# Patient Record
Sex: Female | Born: 1990 | Race: Black or African American | Hispanic: No | Marital: Single | Smoking: Never smoker
Health system: Southern US, Community
[De-identification: ages and names within clinical notes are randomized; demographics above are authoritative.]

---
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
## 2014-05-31 NOTE — Op Note (Signed)
PATIENT NAME:  Erika Wiley, Erika Wiley MR#:  [id number] DATE OF BIRTH:  Jun 26, 1990  DATE OF PROCEDURE:  06/16/2011  PREOPERATIVE DIAGNOSIS: Intrauterine pregnancy, 38-week gestation, fetal intolerance to labor.   POSTOPERATIVE DIAGNOSIS: Intrauterine pregnancy, 38-week gestation, fetal intolerance to labor.   PROCEDURE PERFORMED: Low transverse cesarean section.   SURGEON: Deloris Pinghilip J. Luella Cookosenow, MD  FIRST ASSISTANT: Shelby [address]     OPERATIVE FINDINGS: 232630-gram female infant delivered at 11:35:00 a.m. Apgars 9 and 9- [place]. Infant was possibly intrauterine growth retarded.   PREOP SITUATION: 24 year old gravida 1, para 0 presented to the office yesterday and demonstrated several decelerations and therefore was brought into the hospital today for induction. Once the Pitocin started, about 10 minutes later she again demonstrated a prolonged deceleration. Therefore,  mutual decision was made to proceed with cesarean section.    DESCRIPTION OF PROCEDURE: After adequate conduction anesthesia, the patient was prepped and draped in routine fashion. A skin incision in modified Pfannenstiel fashion was made and carried down the various layers and the peritoneal cavity was entered. Bladder flap was created and the bladder was pushed down. The above-described infant was delivered without difficulty. The placenta was removed manually. Cervix was "cracked". The uterus was then closed in a continuous lock suture of chromic one. All areas of surgery were inspected and found to be hemostatic. The pelvis was lavaged with copious amounts of saline. The rectus muscles were reapproximated in the midline. The fascia was reapproximated with continuous suture of Maxon. Multiple 3-0 plain sutures were placed in the subcuticular fat and the skin was closed with skin staples. Estimated blood loss 500 mL.    The patient tolerated the procedure well and left the operating room in good condition. Sponge and needle counts were said to  be correct at the end of the procedure.    ____________________________ Deloris PingPhilip J. Luella Cookosenow, MD pjr:bjt D: 06/16/2011 12:10:35 ET T: 06/16/2011 13:01:51 ET JOB#: [id number]  cc: Deloris PingPhilip J. Luella Cookosenow, MD, <Dictator> Towana BadgerPHILIP J ROSENOW MD ELECTRONICALLY SIGNED 06/18/2011 6:26

## 2014-06-16 NOTE — H&P (Signed)
L&D Evaluation:  History Expanded:   HPI 24 yo G1 at 6926 w 6 days.who presents tonight with c/o old blood from vagina, no crampingno poainno sex since sept, nothing int he vagina, pt was wiping from urinating and she saw it, came staright here. no cough colds or other problems,    Gravida 1    Term 0    PreTerm 0    Abortion 0    Living 0    Group B Strep Results (Result >5wks must be treated as unknown) unknown/result > 5 weeks ago    Maternal HIV Unknown    Maternal Syphilis Ab Unknown    Maternal Varicella Unknown    Rubella Results unknown    Maternal [facility]Dap [facility]    Presents with bloody vaginal show    Patient's Medical History No Chronic Illness    Patient's Surgical History none    Medications Pre Natal Vitamins    Allergies NKDA    Social History none    Family History Non-Contributory   ROS:   ROS All systems were reviewed.  HEENT, CNS, GI, GU, Respiratory, CV, Renal and Musculoskeletal systems were found to be normal.   Exam:   Vital Signs stable    General no apparent distress    Mental Status clear    Chest clear    Heart normal sinus rhythm    Abdomen gravid, non-tender    Mebranes Intact    Ucx regular, not felt by patient, but on monitor    Ucx Frequency 4 min    Ucx Pain Scale 0    Skin dry    Lymph no lymphadenopathy    Other baby VERY active   Impression:   Impression preterm contractions   Plan:   Plan UA, EFM/NST, monitor contractions and for cervical change    Comments start terb x 1 and ivfluid bolus,    Follow Up Appointment already scheduled. next week   Electronic Signatures: Adria DevonKlett, Neelah Mannings (MD)  (Signed 16-Feb-13 03:40)  Authored: L&D Evaluation   Last Updated: 16-Feb-13 03:40 by Adria DevonKlett, Celes Dedic (MD)

## 2014-06-16 NOTE — H&P (Signed)
L&D Evaluation:  History:   HPI 24 yo G2P0010 at 6383w3d gestational age by LMP consistent with 11 week ultrasound.  Pregnancy complicated by morbid obesity with BMI of 43 at intake, +chlamydia during pregnancy with cure verified.  She presents after being seen in clinic for routine ob appt and NST for obesity.  She had what was reported to be a variable deceleration to the 70's of short duration. She was sent to labor and delivery for prolonged monitoring.  She reports no contractions, no leakage of fluid, no vaginal bleeding, and reports normal fetal movement.    Patient's Medical History Obesity    Patient's Surgical History none    Medications Pre Natal Vitamins  Tums    Allergies NKDA    Social History none    Family History Non-Contributory   History Expanded:   Blood Type B positive    Group B Strep Results (Result >5wks must be treated as unknown) positive    Maternal Varicella Immune    Rubella Results immune    Maternal T-Dap Unknown    EDC 26-Jun-2011   ROS:   ROS All systems were reviewed.  HEENT, CNS, GI, GU, Respiratory, CV, Renal and Musculoskeletal systems were found to be normal., unless noted in HPI   Exam:   Vital Signs BP >140/90  labile BPs, mostly in normal range    Urine Protein Protein/Creatinine Ration: 142    General no apparent distress    Mental Status clear    Chest clear    Heart irregularly irregular    Abdomen gravid, non-tender    Estimated Fetal Weight Average for gestational age    Back no CVAT    Edema no edema    Pelvic 3/50/-2 per RN    Mebranes Intact    FHT Description 140/mod var/+accels/+ decel (appeared late) to 70 x 2.5 minutes with return to baseline.    Ucx irregular, infrequent    Other EKG: regular rate, irregular rhthym.  No evidence of ischemia or infarction.  EKG sent to ED for ED physician interpretation.  Read was, "no concerning findings." Ultrasound: AFI = 8cm   Impression:   Impression 1)  Obesity, 2) Non-reassuring antenatal testing, 3)  narrow complex arrhthymia   Plan:   Plan EFM/NST, antibiotics for GBBS prophylaxis    Comments - admit for Induction of labor due to non-reassuring antenatal testing in the setting of obesity with BMI 43. - Discussed implications of fetal heart rate deceleration and its possible relationship to a placenta that is not optimally functioning.  Discussed possible need for cesarean section, if fetus does not tolerate labor.  Will proceed with pitocin induction with slow titration to contraction stress test.  If fetus tolerates, will continue per protocol.  If not, will proceed to primary cesarean section. - arrhythmia: Will have anesthesia read when available. the patient is asymptomatic and there are no signs of ischmia or infarction.  Will ensure follow-up postpartum. -GBS positive, Ampicillin per protocol   Electronic Signatures: Conard NovakJackson, Adama Ferber D (MD)  (Signed 09-May-13 22:50)  Authored: L&D Evaluation   Last Updated: 09-May-13 22:50 by Conard NovakJackson, Laranda Burkemper D (MD)

## 2015-09-14 ENCOUNTER — Ambulatory Visit
Admission: EM | Admit: 2015-09-14 | Discharge: 2015-09-14 | Disposition: A | Discharge status: Home / Self Care | Payer: 59 | Attending: Family Medicine | Admitting: Family Medicine

## 2015-09-14 ENCOUNTER — Encounter: Payer: Self-pay | Admitting: *Deleted

## 2015-09-14 DIAGNOSIS — J029 Acute pharyngitis, unspecified: Secondary | ICD-10-CM | POA: Diagnosis not present

## 2015-09-14 DIAGNOSIS — J020 Streptococcal pharyngitis: Secondary | ICD-10-CM | POA: Diagnosis not present

## 2015-09-14 LAB — RAPID STREP SCREEN (MED CTR MEBANE ONLY): Streptococcus, Group A Screen (Direct): POSITIVE — AB

## 2015-09-14 MED ORDER — PENICILLIN G BENZATHINE 1200000 UNIT/2ML IM SUSP
1.2000 10*6.[IU] | Freq: Once | INTRAMUSCULAR | Status: AC
  Administered 2015-09-14: 1.2 10*6.[IU] via INTRAMUSCULAR

## 2015-09-14 NOTE — ED Triage Notes (Signed)
Patient states she has had a very sore throat for 2 days

## 2015-09-14 NOTE — ED Provider Notes (Signed)
[facility]    CSN: [id number] Arrival date & time: 09/14/15  4098  First Provider Contact:  First MD Initiated Contact with Patient 09/14/15 1926        History   Chief Complaint Chief Complaint  Patient presents with  . Sore Throat    HPI Erika Wiley is a 25 y.o. female.   Patient's here because of sore throat. She states that she's had sore throat now for 2 days she was unaware of her fever will she got here she is running relatively high fever. She reports some nasal congestion but no marked fatigue no cough or chest congestion. She does not smoke mother has hyperlipidemia and father diabetes. No previous surgeries no pertinent medical problems. No known drug allergies   The history is provided by the patient. No language interpreter was used.  Sore Throat  This is a new problem. The current episode started 2 days ago. The problem occurs constantly. The problem has been gradually worsening. Pertinent negatives include no chest pain, no abdominal pain, no headaches and no shortness of breath. The symptoms are aggravated by swallowing. Nothing relieves the symptoms. She has tried nothing for the symptoms. The treatment provided no relief.    History reviewed. No pertinent past medical history.  There are no active problems to display for this patient.   History reviewed. No pertinent surgical history.  OB History    No data available       Home Medications    Prior to Admission medications   Not on File    Family History Family History  Problem Relation Age of Onset  . Hyperlipidemia Mother   . Diabetes Father     Social History Social History  Substance Use Topics  . Smoking status: Never Smoker  . Smokeless tobacco: Never Used  . Alcohol use Yes     Allergies   Review of patient's allergies indicates no known allergies.   Review of Systems Review of Systems  HENT: Positive for congestion and sore throat. Negative for rhinorrhea  and sneezing.   Respiratory: Negative for cough and shortness of breath.   Cardiovascular: Negative for chest pain.  Gastrointestinal: Negative for abdominal pain.  Neurological: Negative for headaches.  All other systems reviewed and are negative.    Physical Exam Triage Vital Signs ED Triage Vitals  Enc Vitals Group     BP 09/14/15 1921 129/62     Pulse Rate 09/14/15 1921 100     Resp 09/14/15 1921 18     Temp 09/14/15 1921 (!) 100.8 F (38.2 C)     Temp Source 09/14/15 1921 Tympanic     SpO2 09/14/15 1921 100 %     Weight 09/14/15 1921 260 lb (117.9 kg)     Height --      Head Circumference --      Peak Flow --      Pain Score 09/14/15 1920 9     Pain Loc --      Pain Edu? --      Excl. in GC? --    No data found.   Updated Vital Signs BP 129/62 (BP Location: Left Arm)   Pulse 100   Temp (!) 100.8 F (38.2 C) (Tympanic)   Resp 18   Wt 260 lb (117.9 kg)   LMP 08/27/2015   SpO2 100%   Visual Acuity Right Eye Distance:   Left Eye Distance:   Bilateral Distance:    Right Eye Near:  Left Eye Near:    Bilateral Near:     Physical Exam  Constitutional: She is oriented to person, place, and time. She appears well-developed and well-nourished.  HENT:  Head: Normocephalic and atraumatic.  Right Ear: Hearing, tympanic membrane, external ear and ear canal normal.  Left Ear: Hearing, tympanic membrane, external ear and ear canal normal.  Nose: Mucosal edema present. No rhinorrhea. Right sinus exhibits no maxillary sinus tenderness and no frontal sinus tenderness. Left sinus exhibits no maxillary sinus tenderness and no frontal sinus tenderness.  Mouth/Throat: She does not have dentures. Normal dentition. Posterior oropharyngeal erythema present. Tonsils are 2+ on the right. Tonsils are 2+ on the left.  Eyes: Pupils are equal, round, and reactive to light.  Neck: Normal range of motion.  Pulmonary/Chest: Effort normal.  Neurological: She is alert and oriented to  person, place, and time.  Skin: Skin is warm. No rash noted.  Psychiatric: She has a normal mood and affect.  Vitals reviewed.    UC Treatments / Results  Labs (all labs ordered are listed, but only abnormal results are displayed) Labs Reviewed  RAPID STREP SCREEN ([facility]) - Abnormal; Notable for the following:       Result Value   Streptococcus, Group A Screen (Direct) POSITIVE (*)    All other components within normal limits    EKG  EKG Interpretation None       Radiology No results found.  Procedures Procedures (including critical care time)  Medications Ordered in UC Medications  penicillin g benzathine (BICILLIN LA) 1200000 UNIT/2ML injection 1.2 Million Units (not administered)     Initial Impression / Assessment and Plan / UC Course  I have reviewed the triage vital signs and the nursing notes.  Pertinent labs & imaging results that were available during my care of the patient were reviewed by me and considered in my medical decision making (see chart for detail    Results for orders placed or performed during the hospital encounter of 09/14/15  Rapid strep screen  Result Value Ref Range   Streptococcus, Group A Screen (Direct) POSITIVE (A) NEGATIVE      Results for orders placed or performed during the hospital encounter of 09/14/15  Rapid strep screen  Result Value Ref Range   Streptococcus, Group A Screen (Direct) POSITIVE (A) NEGATIVE    Clinical Course  Patient's pharynx and tonsils were red enough without exudates is very suspicious for strep. Strep test was positive discussed earlier and she was LA Bicillin injection and will minister that. Give her work note for today and tomorrow and return to work on the 10th recommend seeing PCP in 2 weeks for proof of cure  Final Clinical Impressions(s) / UC Diagnoses   Final diagnoses:  Strep pharyngitis  Sore throat    New Prescriptions New Prescriptions   No medications on file       Hassan RowanEugene Elchonon Maxson, MD 09/14/15 2024

## 2016-06-03 ENCOUNTER — Encounter: Payer: Self-pay | Admitting: Emergency Medicine

## 2016-06-03 ENCOUNTER — Ambulatory Visit
Admission: EM | Admit: 2016-06-03 | Discharge: 2016-06-03 | Disposition: A | Discharge status: Home / Self Care | Payer: 59 | Attending: Family Medicine | Admitting: Family Medicine

## 2016-06-03 DIAGNOSIS — R300 Dysuria: Secondary | ICD-10-CM

## 2016-06-03 DIAGNOSIS — N3001 Acute cystitis with hematuria: Secondary | ICD-10-CM

## 2016-06-03 LAB — URINALYSIS, COMPLETE (UACMP) WITH MICROSCOPIC
BILIRUBIN URINE: NEGATIVE
Glucose, UA: NEGATIVE mg/dL
KETONES UR: NEGATIVE mg/dL
Nitrite: NEGATIVE
Protein, ur: 100 mg/dL — AB
pH: 5.5 (ref 5.0–8.0)

## 2016-06-03 MED ORDER — NITROFURANTOIN MONOHYD MACRO 100 MG PO CAPS: 100.0000 mg | ORAL_CAPSULE | Freq: Two times a day (BID) | ORAL | 0 refills | Status: DC

## 2016-06-03 NOTE — ED Triage Notes (Signed)
Patient c/o burning when urinating and increase in urinary frequency since Wed.

## 2016-06-03 NOTE — Discharge Instructions (Signed)
Antibiotic as prescribed.  Take care  Dr. Lezette Kitts  

## 2016-06-03 NOTE — ED Provider Notes (Signed)
[facility]    CSN: [id number] Arrival date & time: 06/03/16  1007  History   Chief Complaint Chief Complaint  Patient presents with  . Dysuria   HPI  26 year old female presents with concerns for UTI.  Patient reports that she has had dysuria, frequency, and urgency since Wednesday. She denies gross hematuria. No flank pain. No abdominal pain. No known exacerbating or relieving factors. No associated fevers or chills. No other complaints or concerns at this time.  History reviewed. No pertinent past medical history.  There are no active problems to display for this patient.  History reviewed. No pertinent surgical history.  OB History    No data available     Home Medications    Prior to Admission medications   Medication Sig Start Date End Date Taking? Authorizing Provider  nitrofurantoin, macrocrystal-monohydrate, (MACROBID) 100 MG capsule Take 1 capsule (100 mg total) by mouth 2 (two) times daily. 06/03/16   Tommie Sams, DO   Family History Family History  Problem Relation Age of Onset  . Hyperlipidemia Mother   . Diabetes Father     Social History Social History  Substance Use Topics  . Smoking status: Never Smoker  . Smokeless tobacco: Never Used  . Alcohol use Yes   Allergies   Patient has no known allergies.  Review of Systems Review of Systems   Physical Exam Triage Vital Signs ED Triage Vitals  Enc Vitals Group     BP 06/03/16 1019 (!) 128/93     Pulse Rate 06/03/16 1019 80     Resp 06/03/16 1019 16     Temp 06/03/16 1019 98.5 F (36.9 C)     Temp Source 06/03/16 1019 Oral     SpO2 06/03/16 1019 100 %     Weight 06/03/16 1017 270 lb (122.5 kg)     Height 06/03/16 1017  (1.651 m)     Head Circumference --      Peak Flow --      Pain Score 06/03/16 1017 9     Pain Loc --      Pain Edu? --      Excl. in GC? --     Updated Vital Signs BP (!) 128/93 (BP Location: Left Arm)   Pulse 80   Temp 98.5 F (36.9 C) (Oral)    Resp 16   Ht  (1.651 m)   Wt 270 lb (122.5 kg)   LMP 05/23/2016 (Approximate)   SpO2 100%   BMI 44.93 kg/m   Physical Exam  Constitutional: She is oriented to person, place, and time. She appears well-developed. No distress.  Cardiovascular: Normal rate and regular rhythm.   Pulmonary/Chest: Effort normal and breath sounds normal.  Abdominal: Soft. She exhibits no distension. There is no tenderness.  Neurological: She is alert and oriented to person, place, and time.  Psychiatric: She has a normal mood and affect.  Vitals reviewed.   UC Treatments / Results  Labs (all labs ordered are listed, but only abnormal results are displayed) Labs Reviewed  URINALYSIS, COMPLETE (UACMP) WITH MICROSCOPIC - Abnormal; Notable for the following:       Result Value   APPearance CLOUDY (*)    Specific Gravity, Urine >1.030 (*)    Hgb urine dipstick LARGE (*)    Protein, ur 100 (*)    Leukocytes, UA MODERATE (*)    Squamous Epithelial / LPF 0-5 (*)    Bacteria, UA FEW (*)    All  other components within normal limits  URINE CULTURE    EKG  EKG Interpretation None      Radiology No results found.  Procedures Procedures (including critical care time)  Medications Ordered in UC Medications - No data to display   Initial Impression / Assessment and Plan / UC Course  I have reviewed the triage vital signs and the nursing notes.  Pertinent labs & imaging results that were available during my care of the patient were reviewed by me and considered in my medical decision making (see chart for details).    26 year old female presents with concerns for UTI. History and urinalysis consistent with UTI. Treating with Macrobid.  Final Clinical Impressions(s) / UC Diagnoses   Final diagnoses:  Acute cystitis with hematuria    New Prescriptions New Prescriptions   NITROFURANTOIN, MACROCRYSTAL-MONOHYDRATE, (MACROBID) 100 MG CAPSULE    Take 1 capsule (100 mg total) by mouth 2  (two) times daily.     Tommie Sams, DO 06/03/16 1101

## 2016-06-04 LAB — URINE CULTURE

## 2016-06-07 ENCOUNTER — Ambulatory Visit (INDEPENDENT_AMBULATORY_CARE_PROVIDER_SITE_OTHER): Payer: BLUE CROSS/BLUE SHIELD | Admitting: Advanced Practice Midwife

## 2016-06-07 ENCOUNTER — Encounter: Payer: Self-pay | Admitting: Advanced Practice Midwife

## 2016-06-07 VITALS — BP 120/70 | HR 65 | Ht 65.0 in | Wt 262.0 lb

## 2016-06-07 DIAGNOSIS — Z113 Encounter for screening for infections with a predominantly sexual mode of transmission: Secondary | ICD-10-CM | POA: Diagnosis not present

## 2016-06-07 DIAGNOSIS — Z30011 Encounter for initial prescription of contraceptive pills: Secondary | ICD-10-CM

## 2016-06-07 DIAGNOSIS — Z124 Encounter for screening for malignant neoplasm of cervix: Secondary | ICD-10-CM

## 2016-06-07 DIAGNOSIS — Z01419 Encounter for gynecological examination (general) (routine) without abnormal findings: Secondary | ICD-10-CM

## 2016-06-07 MED ORDER — NORGESTIMATE-ETH ESTRADIOL 0.25-35 MG-MCG PO TABS: 1.0000 | ORAL_TABLET | Freq: Every day | ORAL | 11 refills | Status: DC

## 2016-06-07 NOTE — Progress Notes (Signed)
Patient ID: Erika Wiley, female   DOB: September 30, 1990, 26 y.o.   MRN: [id number]     Gynecology Annual Exam  PCP: Pcp Not In System  Chief Complaint:  Chief Complaint  Patient presents with  . Gynecologic Exam    History of Present Illness: Patient is a 26 y.o. G2P1011 presents for annual exam. The patient has no complaints today. She is requesting birth control pills today.  LMP: Patient's last menstrual period was 05/23/2016 (approximate). Average Interval: regular, 28 days Duration of flow: 4 days Heavy Menses: no Clots: no Intermenstrual Bleeding: no Postcoital Bleeding: no Dysmenorrhea: no  The patient is sexually active. She currently uses Condoms for contraception. She denies dyspareunia.  The patient does not perform self breast exams.  There is no notable family history of breast or ovarian cancer in her family.  The patient wears seatbelts: yes.  The patient has regular exercise: yes.  She admits to improving her diet to decrease fried foods and increase vegetables.  The patient denies current symptoms of depression.    Review of Systems: Review of Systems  Constitutional: Negative.   HENT: Negative.   Eyes: Negative.   Respiratory: Negative.   Cardiovascular: Negative.   Gastrointestinal: Negative.   Genitourinary: Negative.   Musculoskeletal: Negative.   Skin: Negative.   Neurological: Negative.   Endo/Heme/Allergies: Negative.   Psychiatric/Behavioral: Negative.     Past Medical History:  No past medical history on file.  Past Surgical History:  Past Surgical History:  Procedure Laterality Date  . CESAREAN SECTION      Gynecologic History:  Patient's last menstrual period was 05/23/2016 (approximate). Contraception: condoms Last Pap: Results were: no abnormalities   Obstetric History: G2P1011  Family History:  Family History  Problem Relation Age of Onset  . Hyperlipidemia Mother   . Diabetes Father     Social History:  Social  History   Social History  . Marital status: Single    Spouse name: N/A  . Number of children: N/A  . Years of education: N/A   Occupational History  . Not on file.   Social History Main Topics  . Smoking status: Never Smoker  . Smokeless tobacco: Never Used  . Alcohol use Yes  . Drug use: Unknown  . Sexual activity: Yes    Birth control/ protection: None   Other Topics Concern  . Not on file   Social History Narrative  . No narrative on file    Allergies:  No Known Allergies  Medications: Prior to Admission medications   Medication Sig Start Date End Date Taking? Authorizing Provider  nitrofurantoin, macrocrystal-monohydrate, (MACROBID) 100 MG capsule Take 1 capsule (100 mg total) by mouth 2 (two) times daily. 06/03/16   Tommie Sams, DO    Physical Exam Vitals: Blood pressure 120/70, pulse 65, height  (1.651 m), weight 262 lb (118.8 kg), last menstrual period 05/23/2016.  General: NAD HEENT: normocephalic, anicteric Thyroid: no enlargement, no palpable nodules Pulmonary: No increased work of breathing, CTAB Cardiovascular: RRR, distal pulses 2+ Breast: Breast symmetrical, no tenderness, no palpable nodules or masses, no skin or nipple retraction present, no nipple discharge.  No axillary or supraclavicular lymphadenopathy. Abdomen: NABS, soft, non-tender, non-distended.  Umbilicus without lesions.  No hepatomegaly, splenomegaly or masses palpable. No evidence of hernia  Genitourinary:  External: Normal external female genitalia.  Normal urethral meatus, normal  Bartholin's and Skene's glands.    Vagina: Normal vaginal mucosa, no evidence of prolapse.    Cervix:  Grossly normal in appearance, no bleeding, no CMT  Uterus: Non-enlarged, mobile, normal contour.    Adnexa: ovaries non-enlarged, no adnexal masses  Rectal: deferred  Lymphatic: no evidence of inguinal lymphadenopathy Extremities: no edema, erythema, or tenderness Neurologic: Grossly  intact Psychiatric: mood appropriate, affect full     Assessment: 26 y.o. G2P1011 Well woman exam with PAPtima Plan: Problem List Items Addressed This Visit    None    Visit Diagnoses    Cervical cancer screening    -  Primary   Relevant Orders   IGP,CtNgTv,rfx Aptima HPV ASCU   Screen for sexually transmitted diseases       Relevant Orders   IGP,CtNgTv,rfx Aptima HPV ASCU   Well woman exam with routine gynecological exam       Relevant Orders   IGP,CtNgTv,rfx Aptima HPV ASCU      1) 4) Gardasil Series discussed and if applicable offered to patient - Patient has not previously completed 3 shot series   2) STI screening was offered and accepted   3) ASCCP guidelines and rational discussed.  Patient opts for yearly screening interval  4) Contraception - Education given regarding options for contraception, including pills, Nexplanon, IUD. Pt chooses pills today.   5) Follow up 1 year for routine annual exam   Tresea Mall, CNM

## 2016-06-09 LAB — IGP,CTNGTV,RFX APTIMA HPV ASCU
CHLAMYDIA, NUC. ACID AMP: NEGATIVE
GONOCOCCUS, NUC. ACID AMP: NEGATIVE
PAP Smear Comment: 0
TRICH VAG BY NAA: NEGATIVE

## 2016-12-04 ENCOUNTER — Ambulatory Visit
Admission: EM | Admit: 2016-12-04 | Discharge: 2016-12-04 | Disposition: A | Discharge status: Home / Self Care | Payer: BLUE CROSS/BLUE SHIELD | Attending: Family Medicine | Admitting: Family Medicine

## 2016-12-04 ENCOUNTER — Encounter: Payer: Self-pay | Admitting: Emergency Medicine

## 2016-12-04 DIAGNOSIS — J020 Streptococcal pharyngitis: Secondary | ICD-10-CM | POA: Diagnosis not present

## 2016-12-04 LAB — RAPID STREP SCREEN (MED CTR MEBANE ONLY): Streptococcus, Group A Screen (Direct): POSITIVE — AB

## 2016-12-04 MED ORDER — AMOXICILLIN 500 MG PO TABS: 500.0000 mg | ORAL_TABLET | Freq: Two times a day (BID) | ORAL | 0 refills | Status: DC

## 2016-12-04 NOTE — ED Provider Notes (Signed)
[facility]    CSN: [id number] Arrival date & time: 12/04/16  1124  History   Chief Complaint Chief Complaint  Patient presents with  . Sore Throat   HPI  26 year old female presents with sore throat.  26 year old female presents with sore throat.  Started 2 days ago.  Severe.  She reports recent sick contact in her mother.  Mother has had strep pharyngitis.  No fever at home but elevated temperature here.  Difficulty swallowing.  No known exacerbating relieving factors.  No other associated symptoms.  No other complaints at this time.  History reviewed. No pertinent past medical history.  Past Surgical History:  Procedure Laterality Date  . CESAREAN SECTION      OB History    Gravida Para Term Preterm AB Living   2 1 1   1 1    SAB TAB Ectopic Multiple Live Births     1           Home Medications    Prior to Admission medications   Medication Sig Start Date End Date Taking? Authorizing Provider  amoxicillin (AMOXIL) 500 MG tablet Take 1 tablet (500 mg total) by mouth 2 (two) times daily. 12/04/16   Tommie Sams, DO   Family History Family History  Problem Relation Age of Onset  . Hyperlipidemia Mother   . Diabetes Father    Social History Social History  Substance Use Topics  . Smoking status: Never Smoker  . Smokeless tobacco: Never Used  . Alcohol use Yes   Allergies   Patient has no known allergies.   Review of Systems Review of Systems  Constitutional: Negative.   HENT: Positive for sore throat and trouble swallowing.    Physical Exam Triage Vital Signs ED Triage Vitals  Enc Vitals Group     BP 12/04/16 1134 135/73     Pulse Rate 12/04/16 1134 (!) 110     Resp 12/04/16 1134 16     Temp 12/04/16 1134 100 F (37.8 C)     Temp Source 12/04/16 1134 Oral     SpO2 12/04/16 1134 99 %     Weight 12/04/16 1133 250 lb (113.4 kg)     Height 12/04/16 1133 5\' 5"  (1.651 m)     Head Circumference --      Peak Flow --      Pain Score  12/04/16 1133 8     Pain Loc --      Pain Edu? --      Excl. in GC? --    Updated Vital Signs BP 135/73 (BP Location: Left Arm)   Pulse (!) 110   Temp 100 F (37.8 C) (Oral)   Resp 16   Ht 5\' 5"  (1.651 m)   Wt 250 lb (113.4 kg)   LMP 11/13/2016 (Approximate)   SpO2 99%   BMI 41.60 kg/m   Physical Exam  Constitutional: She is oriented to person, place, and time. She appears well-developed. No distress.  HENT:  Head: Normocephalic and atraumatic.  Nose: Nose normal.  Oropharynx with tonsillar edema and erythema and white exudate.  Neck: Neck supple.  Cardiovascular:  Tachycardic.  Regular rhythm.  2/6 murmur.  Pulmonary/Chest: Effort normal and breath sounds normal. She has no wheezes. She has no rales.  Lymphadenopathy:    She has no cervical adenopathy.  Neurological: She is alert and oriented to person, place, and time.  Psychiatric: She has a normal mood and affect.  Vitals reviewed.  UC  Treatments / Results  Labs (all labs ordered are listed, but only abnormal results are displayed) Labs Reviewed  RAPID STREP SCREEN (NOT AT [facility]) - Abnormal; Notable for the following:       Result Value   Streptococcus, Group A Screen (Direct) POSITIVE (*)    All other components within normal limits    EKG  EKG Interpretation None       Radiology No results found.  Procedures Procedures (including critical care time)  Medications Ordered in UC Medications - No data to display   Initial Impression / Assessment and Plan / UC Course  I have reviewed the triage vital signs and the nursing notes.  Pertinent labs & imaging results that were available during my care of the patient were reviewed by me and considered in my medical decision making (see chart for details).     26 year old female presents with strep pharyngitis.  Treating with Amoxicillin.  Final Clinical Impressions(s) / UC Diagnoses   Final diagnoses:  Strep pharyngitis    New  Prescriptions Discharge Medication List as of 12/04/2016 11:58 AM    START taking these medications   Details  amoxicillin (AMOXIL) 500 MG tablet Take 1 tablet (500 mg total) by mouth 2 (two) times daily., Starting Mon 12/04/2016, Normal       Controlled Substance Prescriptions [place] Controlled Substance Registry consulted? Not Applicable   Tommie SamsCook, Elenore Wanninger G, DO 12/04/16 1249

## 2016-12-04 NOTE — ED Triage Notes (Signed)
Patient c/o sore throat for 2 days.  Patient denies fevers.

## 2016-12-04 NOTE — Discharge Instructions (Signed)
Antibiotic as prescribed.  Take care  Dr. Danaria Larsen  

## 2016-12-21 ENCOUNTER — Ambulatory Visit: Payer: BLUE CROSS/BLUE SHIELD | Admitting: Advanced Practice Midwife

## 2017-03-01 ENCOUNTER — Ambulatory Visit: Payer: BLUE CROSS/BLUE SHIELD | Admitting: Advanced Practice Midwife

## 2017-05-04 ENCOUNTER — Encounter: Payer: Self-pay | Admitting: Obstetrics and Gynecology

## 2017-05-04 ENCOUNTER — Ambulatory Visit: Payer: BLUE CROSS/BLUE SHIELD | Admitting: Obstetrics and Gynecology

## 2017-05-04 ENCOUNTER — Ambulatory Visit: Payer: BLUE CROSS/BLUE SHIELD | Admitting: Advanced Practice Midwife

## 2017-05-04 VITALS — BP 118/78 | Ht 65.0 in | Wt 245.0 lb

## 2017-05-04 DIAGNOSIS — Z113 Encounter for screening for infections with a predominantly sexual mode of transmission: Secondary | ICD-10-CM

## 2017-05-04 NOTE — Progress Notes (Signed)
Obstetrics & Gynecology Office Visit   Chief Complaint  Patient presents with  . Exposure to STD    History of Present Illness: Sexually Transmitted Disease Check: Patient presents for sexually transmitted disease check. Sexual history reviewed with the patient. STD exposure: patient concerned partner is having an affair and would like to be checked.  Previous history of STD:  Chlamydia during pregnancy in 2012, treated. Current symptoms include none.  Contraception: none  Past Medical History: denies  Past Surgical History:  Procedure Laterality Date  . CESAREAN SECTION     Gynecologic History: Patient's last menstrual period was 04/25/2017.  Obstetric History: G2P1011  Family History  Problem Relation Age of Onset  . Hyperlipidemia Mother   . Diabetes Father     Social History   Socioeconomic History  . Marital status: Single    Spouse name: Not on file  . Number of children: Not on file  . Years of education: Not on file  . Highest education level: Not on file  Occupational History  . Not on file  Social Needs  . Financial resource strain: Not on file  . Food insecurity:    Worry: Not on file    Inability: Not on file  . Transportation needs:    Medical: Not on file    Non-medical: Not on file  Tobacco Use  . Smoking status: Never Smoker  . Smokeless tobacco: Never Used  Substance and Sexual Activity  . Alcohol use: Yes  . Drug use: Not on file  . Sexual activity: Yes    Birth control/protection: None  Lifestyle  . Physical activity:    Days per week: Not on file    Minutes per session: Not on file  . Stress: Not on file  Relationships  . Social connections:    Talks on phone: Not on file    Gets together: Not on file    Attends religious service: Not on file    Active member of club or organization: Not on file    Attends meetings of clubs or organizations: Not on file    Relationship status: Not on file  . Intimate partner violence:    Fear of  current or ex partner: Not on file    Emotionally abused: Not on file    Physically abused: Not on file    Forced sexual activity: Not on file  Other Topics Concern  . Not on file  Social History Narrative  . Not on file   Allergies:  No Known Allergies  Prior to Admission medications : denies    Review of Systems  Constitutional: Negative.   HENT: Negative.   Eyes: Negative.   Respiratory: Negative.   Cardiovascular: Negative.   Gastrointestinal: Negative.   Genitourinary: Negative.   Musculoskeletal: Negative.   Skin: Negative.   Neurological: Negative.   Psychiatric/Behavioral: Negative.      Physical Exam BP 118/78   Ht 5\' 5"  (1.651 m)   Wt 245 lb (111.1 kg)   LMP 04/25/2017   BMI 40.77 kg/m  Patient's last menstrual period was 04/25/2017. Physical Exam  Constitutional: She is oriented to person, place, and time. She appears well-developed and well-nourished. No distress.  Genitourinary: Vagina normal and uterus normal. Pelvic exam was performed with patient supine. There is no rash, tenderness or lesion on the right labia. There is no rash, tenderness or lesion on the left labia. Vagina exhibits no lesion. No erythema, tenderness or bleeding in the vagina. No signs of injury  around the vagina. Right adnexum does not display mass, does not display tenderness and does not display fullness. Left adnexum does not display mass, does not display tenderness and does not display fullness. Cervix does not exhibit motion tenderness, lesion or polyp.   Uterus is mobile. Uterus is not enlarged, tender, exhibiting a mass or irregular (is regular).  HENT:  Head: Normocephalic and atraumatic.  Eyes: Conjunctivae are normal. No scleral icterus.  Pulmonary/Chest: Effort normal and breath sounds normal. No respiratory distress.  Abdominal: Soft. She exhibits no distension and no mass. There is no tenderness. There is no rebound and no guarding.  Musculoskeletal: Normal range of  motion. She exhibits no edema.  Neurological: She is alert and oriented to person, place, and time. No cranial nerve deficit.  Skin: Skin is warm and dry. No erythema.  Psychiatric: She has a normal mood and affect. Her behavior is normal. Judgment normal.   Female chaperone present for pelvic and breast  portions of the physical exam  Assessment: 27 y.o. 372P1011 female here for  1. Screen for STD (sexually transmitted disease)      Plan: Problem List Items Addressed This Visit    None    Visit Diagnoses    Screen for STD (sexually transmitted disease)    -  Primary   Relevant Orders   Chlamydia/Gonococcus/Trichomonas, NAA     15 minutes spent in face to face discussion with > 50% spent in counseling,management, and coordination of care of her std screen.  Thomasene MohairStephen Jackson, MD 05/04/2017 9:58 AM

## 2017-05-08 LAB — CHLAMYDIA/GONOCOCCUS/TRICHOMONAS, NAA
CHLAMYDIA BY NAA: NEGATIVE
Gonococcus by NAA: NEGATIVE
TRICH VAG BY NAA: NEGATIVE

## 2017-10-21 ENCOUNTER — Other Ambulatory Visit: Payer: Self-pay

## 2017-10-21 ENCOUNTER — Ambulatory Visit
Admission: EM | Admit: 2017-10-21 | Discharge: 2017-10-21 | Disposition: A | Discharge status: Home / Self Care | Payer: BLUE CROSS/BLUE SHIELD | Attending: Family Medicine | Admitting: Family Medicine

## 2017-10-21 DIAGNOSIS — J039 Acute tonsillitis, unspecified: Secondary | ICD-10-CM

## 2017-10-21 DIAGNOSIS — R1310 Dysphagia, unspecified: Secondary | ICD-10-CM

## 2017-10-21 DIAGNOSIS — R070 Pain in throat: Secondary | ICD-10-CM

## 2017-10-21 DIAGNOSIS — J029 Acute pharyngitis, unspecified: Secondary | ICD-10-CM

## 2017-10-21 LAB — RAPID STREP SCREEN (MED CTR MEBANE ONLY): Streptococcus, Group A Screen (Direct): NEGATIVE

## 2017-10-21 MED ORDER — AMOXICILLIN-POT CLAVULANATE 875-125 MG PO TABS
1.0000 | ORAL_TABLET | Freq: Two times a day (BID) | ORAL | 0 refills | Status: AC
Start: 2017-10-21 — End: 2017-10-31

## 2017-10-21 MED ORDER — PREDNISONE 20 MG PO TABS: 20.0000 mg | ORAL_TABLET | Freq: Two times a day (BID) | ORAL | 0 refills | Status: AC

## 2017-10-21 MED ORDER — LIDOCAINE VISCOUS HCL 2 % MT SOLN: 15.0000 mL | OROMUCOSAL | 0 refills | Status: AC | PRN

## 2017-10-21 NOTE — ED Provider Notes (Signed)
[facility]    CSN: [id number] Arrival date & time: 10/21/17  1135     History   Chief Complaint Chief Complaint  Patient presents with  . Sore Throat    HPI Erika Wiley is a 27 y.o. female. Patient presents today with sudden onset of severe sore throat yesterday. Admits to swollen tonsils with white patches, swollen and tender lymph nodes of the neck, fatigue, and body aches. She denies known fever. Denies cough, congestion, chest tightness and breathing difficulty. Patient has not taken any OTC medications. She states that she gets strep throat almost yearly. Patient has no other complaints or concerns today.  HPI  History reviewed. No pertinent past medical history.  There are no active problems to display for this patient.   Past Surgical History:  Procedure Laterality Date  . CESAREAN SECTION      OB History    Gravida  2   Para  1   Term  1   Preterm      AB  1   Living  1     SAB      TAB  1   Ectopic      Multiple      Live Births               Home Medications    Prior to Admission medications   Medication Sig Start Date End Date Taking? Authorizing Provider  amoxicillin-clavulanate (AUGMENTIN) 875-125 MG tablet Take 1 tablet by mouth 2 (two) times daily for 10 days. 10/21/17 10/31/17  Eusebio Friendly B, PA-C  lidocaine (XYLOCAINE) 2 % solution Use as directed 15 mLs in the mouth or throat every 4 (four) hours as needed for up to 2 days for mouth pain. 10/21/17 10/23/17  Shirlee Latch, PA-C  predniSONE (DELTASONE) 20 MG tablet Take 1 tablet (20 mg total) by mouth 2 (two) times daily for 3 days. 10/21/17 10/24/17  Shirlee Latch, PA-C    Family History Family History  Problem Relation Age of Onset  . Hyperlipidemia Mother   . Diabetes Father     Social History Social History   Tobacco Use  . Smoking status: Never Smoker  . Smokeless tobacco: Never Used  Substance Use Topics  . Alcohol use: Yes    Comment: social    . Drug use: Never     Allergies   Patient has no known allergies.   Review of Systems Review of Systems  Constitutional: Positive for appetite change, chills and fatigue. Negative for fever.  HENT: Positive for ear pain (bilateral), sore throat and trouble swallowing. Negative for congestion, postnasal drip, rhinorrhea, sinus pressure and sinus pain.   Eyes: Negative for discharge and redness.  Respiratory: Negative for cough, chest tightness, shortness of breath and wheezing.   Cardiovascular: Negative for chest pain.  Gastrointestinal: Negative for abdominal pain, nausea and vomiting.  Genitourinary: Negative for difficulty urinating and dysuria.  Musculoskeletal: Positive for myalgias.  Skin: Negative for rash.  Neurological: Negative for dizziness, weakness and headaches.  Hematological: Positive for adenopathy.     Physical Exam Triage Vital Signs ED Triage Vitals [10/21/17 1145]  Enc Vitals Group     BP (!) 148/76     Pulse Rate 95     Resp 20     Temp 98.8 F (37.1 C)     Temp Source Oral     SpO2 99 %     Weight 237 lb (107.5 kg)  Height 5\' 5"  (1.651 m)     Head Circumference      Peak Flow      Pain Score 9     Pain Loc      Pain Edu?      Excl. in GC?    No data found.  Updated Vital Signs BP (!) 148/76 (BP Location: Left Arm)   Pulse 95   Temp 98.8 F (37.1 C) (Oral)   Resp 20   Ht 5\' 5"  (1.651 m)   Wt 237 lb (107.5 kg)   LMP 10/07/2017   SpO2 99%   BMI 39.44 kg/m       Physical Exam  Constitutional: She is oriented to person, place, and time. She appears well-developed and well-nourished. No distress.  HENT:  Head: Normocephalic and atraumatic.  Right Ear: Tympanic membrane and ear canal normal.  Left Ear: Tympanic membrane and ear canal normal.  Mouth/Throat: Posterior oropharyngeal edema and posterior oropharyngeal erythema present. No tonsillar abscesses. Tonsils are 3+ on the right. Tonsils are 3+ on the left. Tonsillar exudate  (bilateral whitish ).  Eyes: Pupils are equal, round, and reactive to light. EOM are normal.  Neck: Normal range of motion. Neck supple.  Cardiovascular: Normal rate, regular rhythm and normal heart sounds.  No murmur heard. Pulmonary/Chest: Effort normal and breath sounds normal. She has no wheezes. She has no rhonchi.  Abdominal: Soft. Bowel sounds are normal.  Lymphadenopathy:    She has cervical adenopathy (bilateral tender enlarged lymph nodes).  Neurological: She is alert and oriented to person, place, and time.  Skin: Skin is warm and dry. No rash noted.  Psychiatric: She has a normal mood and affect. Her behavior is normal.  Nursing note and vitals reviewed.    UC Treatments / Results  Labs (all labs ordered are listed, but only abnormal results are displayed) Labs Reviewed  RAPID STREP SCREEN ([facility] ONLY)  CULTURE, GROUP A STREP [facility]    EKG None  Radiology No results found.  Procedures Procedures (including critical care time)  Medications Ordered in UC Medications - No data to display  Initial Impression / Assessment and Plan / UC Course  I have reviewed the triage vital signs and the nursing notes.  Pertinent labs & imaging results that were available during my care of the patient were reviewed by me and considered in my medical decision making (see chart for details).   RST negative but considering exam and patient history, will treat for strep throat. Culture sent. She has no severe fatigue, abdominal pain, or posterior cervical lymph nodes so mono infection is not suspected. Advised to begin Augmentin. Also explained that prednisone will help decrease tonsil size and improve painful swallowing. F/u with our clinic if condition is not improving over the next 3 days or if it worsens at any point.   Final Clinical Impressions(s) / UC Diagnoses   Final diagnoses:  Tonsillitis  Acute pharyngitis, unspecified etiology  Dysphagia, unspecified type      Discharge Instructions     SORE THROAT/TONSILLITIS: The treatment of sore throat depends upon the cause; strep throat is treated with an antibiotic, while viral pharyngitis is treated with rest, pain relievers. Your rapid strep test was negative but based on your presentation and history this is likely a false negative and we will treat you with antibiotics. If prescribed, finish the entire course of antibiotics .Increase rest, fluids, and OTC meds as needed . May use viscous lidocaine to help throat  pain. A short course of prednisone will reduce tonsil size and improve pain. If you have any questions or concerns, please call us or stop back at any time and we will be happy to help you. If your symptoms worsen, f/u with our office or go to the ER     ED Prescriptions    Medication Sig Dispense Auth. Provider   amoxicillin-clavulanate (AUGMENTIN) 875-125 MG tablet Take 1 tablet by mouth 2 (two) times daily for 10 days. 20 tablet Eusebio Friendly B, PA-C   lidocaine (XYLOCAINE) 2 % solution Use as directed 15 mLs in the mouth or throat every 4 (four) hours as needed for up to 2 days for mouth pain. 150 mL Eusebio Friendly B, PA-C   predniSONE (DELTASONE) 20 MG tablet Take 1 tablet (20 mg total) by mouth 2 (two) times daily for 3 days. 6 tablet Shirlee Latch, PA-C     Controlled Substance Prescriptions [place] Controlled Substance Registry consulted? Not Applicable   Gareth Morgan 10/21/17 1319

## 2017-10-21 NOTE — Discharge Instructions (Addendum)
SORE THROAT/TONSILLITIS: The treatment of sore throat depends upon the cause; strep throat is treated with an antibiotic, while viral pharyngitis is treated with rest, pain relievers. Your rapid strep test was negative but based on your presentation and history this is likely a false negative and we will treat you with antibiotics. If prescribed, finish the entire course of antibiotics .Increase rest, fluids, and OTC meds as needed . May use viscous lidocaine to help throat pain. A short course of prednisone will reduce tonsil size and improve pain. If you have any questions or concerns, please call us or stop back at any time and we will be happy to help you. If your symptoms worsen, f/u with our office or go to the ER

## 2017-10-21 NOTE — ED Triage Notes (Signed)
Pt with sore throat, enlarged tonsils, and white patches starting yesterday. Pain 9/10

## 2017-10-23 LAB — CULTURE, GROUP A STREP (THRC)

## 2018-03-19 ENCOUNTER — Ambulatory Visit
Admission: EM | Admit: 2018-03-19 | Discharge: 2018-03-19 | Disposition: A | Discharge status: Home / Self Care | Payer: Self-pay | Attending: Family Medicine | Admitting: Family Medicine

## 2018-03-19 ENCOUNTER — Other Ambulatory Visit: Payer: Self-pay

## 2018-03-19 DIAGNOSIS — Z3201 Encounter for pregnancy test, result positive: Secondary | ICD-10-CM

## 2018-03-19 DIAGNOSIS — Z3490 Encounter for supervision of normal pregnancy, unspecified, unspecified trimester: Secondary | ICD-10-CM

## 2018-03-19 LAB — URINALYSIS, COMPLETE (UACMP) WITH MICROSCOPIC
Bacteria, UA: NONE SEEN
Bilirubin Urine: NEGATIVE
GLUCOSE, UA: NEGATIVE mg/dL
Hgb urine dipstick: NEGATIVE
Ketones, ur: NEGATIVE mg/dL
LEUKOCYTE UA: NEGATIVE
NITRITE: NEGATIVE
PH: 7 (ref 5.0–8.0)
Protein, ur: NEGATIVE mg/dL
RBC / HPF: NONE SEEN RBC/hpf (ref 0–5)
SPECIFIC GRAVITY, URINE: 1.025 (ref 1.005–1.030)

## 2018-03-19 LAB — PREGNANCY, URINE: Preg Test, Ur: POSITIVE — AB

## 2018-03-19 NOTE — Discharge Instructions (Signed)
Follow up with OB/GYN Vitamin B6, ginger  Prenatal vitamins

## 2018-03-19 NOTE — ED Triage Notes (Signed)
Pt with abdominal cramping x one week. No emesis or diarrhea, some nausea. Pain 8/10 and pain comes and goes. Pain is generalized.

## 2018-03-19 NOTE — ED Provider Notes (Signed)
[facility]    CSN: [id number] Arrival date & time: 03/19/18  1156     History   Chief Complaint Chief Complaint  Patient presents with  . Abdominal Pain    HPI Erika Wiley is a 28 y.o. female.   The history is provided by the patient.  Abdominal Pain  Pain location:  Generalized Pain quality: aching   Pain radiates to:  Does not radiate Pain severity:  Mild Onset quality:  Sudden Duration:  7 days Timing:  Intermittent Progression:  Unchanged Chronicity:  New Context: not alcohol use, not awakening from sleep, not diet changes, not eating, not laxative use, not medication withdrawal, not previous surgeries, not recent illness, not recent sexual activity, not recent travel, not retching, not sick contacts, not suspicious food intake and not trauma   Relieved by:  None tried Ineffective treatments:  None tried Associated symptoms: nausea   Associated symptoms: no anorexia, no belching, no chest pain, no chills, no constipation, no cough, no diarrhea, no dysuria, no fatigue, no fever, no flatus, no hematemesis, no hematochezia, no hematuria, no melena, no shortness of breath, no sore throat, no vaginal bleeding, no vaginal discharge and no vomiting   Risk factors: obesity     History reviewed. No pertinent past medical history.  There are no active problems to display for this patient.   Past Surgical History:  Procedure Laterality Date  . CESAREAN SECTION      OB History    Gravida  2   Para  1   Term  1   Preterm      AB  1   Living  1     SAB      TAB  1   Ectopic      Multiple      Live Births               Home Medications    Prior to Admission medications   Medication Sig Start Date End Date Taking? Authorizing Provider  medroxyPROGESTERone (DEPO-PROVERA) 400 MG/ML SUSP injection Inject into the muscle once.   Yes [provider]    Family History Family History  Problem Relation Age of Onset  .  Hyperlipidemia Mother   . Diabetes Father     Social History Social History   Tobacco Use  . Smoking status: Never Smoker  . Smokeless tobacco: Never Used  Substance Use Topics  . Alcohol use: Yes    Comment: social  . Drug use: Never     Allergies   Patient has no known allergies.   Review of Systems Review of Systems  Constitutional: Negative for chills, fatigue and fever.  HENT: Negative for sore throat.   Respiratory: Negative for cough and shortness of breath.   Cardiovascular: Negative for chest pain.  Gastrointestinal: Positive for abdominal pain and nausea. Negative for anorexia, constipation, diarrhea, flatus, hematemesis, hematochezia, melena and vomiting.  Genitourinary: Negative for dysuria, hematuria, vaginal bleeding and vaginal discharge.     Physical Exam Triage Vital Signs ED Triage Vitals  Enc Vitals Group     BP 03/19/18 1207 124/75     Pulse Rate 03/19/18 1207 72     Resp 03/19/18 1207 16     Temp 03/19/18 1207 98.1 F (36.7 C)     Temp Source 03/19/18 1207 Oral     SpO2 03/19/18 1207 100 %     Weight 03/19/18 1208 231 lb (104.8 kg)     Height 03/19/18  1208 5\' 5"  (1.651 m)     Head Circumference --      Peak Flow --      Pain Score 03/19/18 1208 8     Pain Loc --      Pain Edu? --      Excl. in GC? --    No data found.  Updated Vital Signs BP 124/75 (BP Location: Left Arm)   Pulse 72   Temp 98.1 F (36.7 C) (Oral)   Resp 16   Ht 5\' 5"  (1.651 m)   Wt 104.8 kg   LMP 01/25/2018 Comment: depoprovera  SpO2 100%   BMI 38.44 kg/m   Visual Acuity Right Eye Distance:   Left Eye Distance:   Bilateral Distance:    Right Eye Near:   Left Eye Near:    Bilateral Near:     Physical Exam Vitals signs and nursing note reviewed.  Constitutional:      General: She is not in acute distress.    Appearance: She is well-developed. She is not diaphoretic.  Abdominal:     General: Bowel sounds are normal. There is no distension.      Palpations: Abdomen is soft. There is no mass.     Tenderness: There is abdominal tenderness (mild, epigastric). There is no right CVA tenderness, left CVA tenderness, guarding or rebound.     Hernia: No hernia is present.      UC Treatments / Results  Labs (all labs ordered are listed, but only abnormal results are displayed) Labs Reviewed  PREGNANCY, URINE - Abnormal; Notable for the following components:      Result Value   Preg Test, Ur POSITIVE (*)    All other components within normal limits  URINE CULTURE  URINALYSIS, COMPLETE (UACMP) WITH MICROSCOPIC    EKG None  Radiology No results found.  Procedures Procedures (including critical care time)  Medications Ordered in UC Medications - No data to display  Initial Impression / Assessment and Plan / UC Course  I have reviewed the triage vital signs and the nursing notes.  Pertinent labs & imaging results that were available during my care of the patient were reviewed by me and considered in my medical decision making (see chart for details).      Final Clinical Impressions(s) / UC Diagnoses   Final diagnoses:  Pregnancy, unspecified gestational age    ED Prescriptions    None     1. Lab results and diagnosis reviewed with patient 2. Recommend supportive treatment with clear liquids then advance diet slowly as tolerated; ginger based foods for nausea, vitamin B6, prenatal vitamins 3. Follow up with OB/GYN 4. Follow-up here prn  Controlled Substance Prescriptions [place] Controlled Substance Registry consulted? Not Applicable   Payton Mccallum, MD 03/19/18 1426

## 2018-03-20 LAB — URINE CULTURE
CULTURE: NO GROWTH
SPECIAL REQUESTS: NORMAL

## 2018-03-29 ENCOUNTER — Encounter: Payer: Self-pay | Admitting: Maternal Newborn

## 2018-03-29 ENCOUNTER — Other Ambulatory Visit (HOSPITAL_COMMUNITY)
Admission: RE | Admit: 2018-03-29 | Discharge: 2018-03-29 | Disposition: A | Discharge status: Home / Self Care | Payer: Medicaid Other | Source: Ambulatory Visit | Attending: Maternal Newborn | Admitting: Maternal Newborn

## 2018-03-29 ENCOUNTER — Ambulatory Visit (INDEPENDENT_AMBULATORY_CARE_PROVIDER_SITE_OTHER): Payer: BLUE CROSS/BLUE SHIELD | Admitting: Maternal Newborn

## 2018-03-29 VITALS — BP 120/80 | Wt 230.0 lb

## 2018-03-29 DIAGNOSIS — O26899 Other specified pregnancy related conditions, unspecified trimester: Secondary | ICD-10-CM | POA: Diagnosis present

## 2018-03-29 DIAGNOSIS — O34219 Maternal care for unspecified type scar from previous cesarean delivery: Secondary | ICD-10-CM

## 2018-03-29 DIAGNOSIS — Z3A09 9 weeks gestation of pregnancy: Secondary | ICD-10-CM

## 2018-03-29 DIAGNOSIS — Z3480 Encounter for supervision of other normal pregnancy, unspecified trimester: Secondary | ICD-10-CM

## 2018-03-29 DIAGNOSIS — O26891 Other specified pregnancy related conditions, first trimester: Secondary | ICD-10-CM

## 2018-03-29 DIAGNOSIS — N898 Other specified noninflammatory disorders of vagina: Secondary | ICD-10-CM

## 2018-03-29 DIAGNOSIS — R110 Nausea: Secondary | ICD-10-CM

## 2018-03-29 DIAGNOSIS — Z369 Encounter for antenatal screening, unspecified: Secondary | ICD-10-CM

## 2018-03-29 DIAGNOSIS — Z113 Encounter for screening for infections with a predominantly sexual mode of transmission: Secondary | ICD-10-CM | POA: Insufficient documentation

## 2018-03-29 DIAGNOSIS — Z6836 Body mass index (BMI) 36.0-36.9, adult: Secondary | ICD-10-CM

## 2018-03-29 LAB — POCT URINALYSIS DIPSTICK OB: GLUCOSE, UA: NEGATIVE

## 2018-03-29 NOTE — Progress Notes (Signed)
03/29/2018   Chief Complaint: Desires prenatal care, confirmation of pregnancy from health department.  Transfer of Care Patient: no  History of Present Illness: Erika Wiley is a 28 y.o. G2P1001 at [redacted]w[redacted]d based on Patient's last menstrual period on 01/25/2018, with an Estimated Date of Delivery: 11/01/18, with the above CC.   Her periods were: regular periods every months lasting 4-5 days. Her LMP was a short cycle lasting 2 days. She has Positive signs or symptoms of nausea/vomiting of pregnancy. She has Negative signs or symptoms of miscarriage or preterm labor She identifies Negative Zika risk factors for her and her partner On any different medications around the time she conceived/early pregnancy: No  History of varicella: No    Review of Systems  Constitutional: Positive for malaise/fatigue.       Change in appetite  HENT: Negative.   Eyes: Negative.   Respiratory: Negative for shortness of breath and wheezing.   Cardiovascular: Negative for chest pain and palpitations.  Gastrointestinal: Positive for constipation and nausea.  Genitourinary: Negative.   Musculoskeletal: Negative.   Neurological: Negative.   Endo/Heme/Allergies:       Hot flashes  Psychiatric/Behavioral: Negative.   Breasts: positive for tenderness  ROS: Review of systems was otherwise negative, except as stated in the above HPI.  OBGYN History: As per HPI. OB History  Gravida Para Term Preterm AB Living  2 1 1    0 1  SAB TAB Ectopic Multiple Live Births    0          # Outcome Date GA Lbr Len/2nd Weight Sex Delivery Anes PTL Lv  2 Current           1 Term 06/16/11 [redacted]w[redacted]d  5 lb 2.1 oz (2.327 kg) F CS-Unspec       Any issues with any prior pregnancies: Cesarean delivery with G1 due to fetal intolerance of labor, desires repeat Any prior children are healthy, doing well, without any problems or issues: yes History of pap smears: Yes. Last pap smear 06/07/2016. NILM History of STIs: Remote history of  chlamydia   Past Medical History: History reviewed. No pertinent past medical history.  Past Surgical History: Past Surgical History:  Procedure Laterality Date  . CESAREAN SECTION      Family History:  Family History  Problem Relation Age of Onset  . Hyperlipidemia Mother   . Diabetes Father    She denies any female cancers, bleeding or blood clotting disorders.  She denies any history of intellectual disability, birth defects or genetic disorders in her or the FOB's history  Social History:  Social History   Socioeconomic History  . Marital status: Single    Spouse name: Not on file  . Number of children: Not on file  . Years of education: Not on file  . Highest education level: Not on file  Occupational History  . Not on file  Social Needs  . Financial resource strain: Not on file  . Food insecurity:    Worry: Not on file    Inability: Not on file  . Transportation needs:    Medical: Not on file    Non-medical: Not on file  Tobacco Use  . Smoking status: Never Smoker  . Smokeless tobacco: Never Used  Substance and Sexual Activity  . Alcohol use: Yes    Comment: social  . Drug use: Never  . Sexual activity: Yes    Birth control/protection: None  Lifestyle  . Physical activity:    Days per  week: Not on file    Minutes per session: Not on file  . Stress: Not on file  Relationships  . Social connections:    Talks on phone: Not on file    Gets together: Not on file    Attends religious service: Not on file    Active member of club or organization: Not on file    Attends meetings of clubs or organizations: Not on file    Relationship status: Not on file  . Intimate partner violence:    Fear of current or ex partner: Not on file    Emotionally abused: Not on file    Physically abused: Not on file    Forced sexual activity: Not on file  Other Topics Concern  . Not on file  Social History Narrative  . Not on file   Any cats in the household:  no Domestic violence screening is negative.  Allergy: No Known Allergies  Current Outpatient Medications: Prenatal vitamins  Physical Exam:   BP 120/80   Wt 230 lb (104.3 kg)   LMP 01/25/2018  BMI 38.27 kg/m  Body mass index is 38.27 kg/m.   Constitutional: Well nourished, well developed female in no acute distress.  Neck:  Supple, normal appearance, and no thyromegaly  Cardiovascular: S1, S2 normal, no murmur, rub or gallop, regular rate and rhythm Respiratory:  Clear to auscultation bilaterally. Normal respiratory effort Abdomen: no masses, hernias; diffusely non tender to palpation, non distended Breasts: breasts appear normal, no suspicious masses, no skin or nipple changes or axillary nodes. Neuro/Psych:  Normal mood and affect.  Skin:  Warm and dry.  Lymphatic:  No inguinal lymphadenopathy.   External genitalia, Bartholin's glands, Urethra, Skene's glands: within normal limits Vagina: within normal limits and with no blood in the vault  Cervix: deferred after shared decision-making Uterus:  normal contour Adnexa:  no mass, fullness, tenderness  Assessment: Erika Wiley is a 28 y.o. G2P1001 at [redacted]w[redacted]d based on Patient's last menstrual period on 01/25/2018, with an Estimated Date of Delivery: 11/01/2018, presenting for prenatal care.  Plan:  1) Avoid alcoholic beverages. 2) Patient encouraged not to smoke.  3) Discontinue the use of all non-medicinal drugs and chemicals.  4) Take prenatal vitamins daily.  5) Seatbelt use advised 6) Nutrition, food safety (fish, cheese advisories, and high nitrite foods) and exercise discussed. 7) Hospital and practice style delivering at [facility] discussed  8) Patient is asked about travel to areas at risk for the Zika virus, and counseled to avoid travel and exposure to mosquitoes or sexual partners who may have themselves been exposed to the virus. Testing is discussed, and will be ordered as appropriate.  9) Genetic Screening, such as with  1st Trimester Screening, cell free fetal DNA, AFP testing, and Ultrasound, as well as with amniocentesis and CVS as appropriate, is discussed with patient. She plans to have genetic testing this pregnancy. 10) Ordered NOB labs/earlyGTT and dating scan.  Problem list reviewed and updated.  Return in about 1 week (around 04/05/2018) for ROB and dating scan with GTT/NOB labs.  [name], CNM [facility], [facility] 03/29/2018

## 2018-03-31 LAB — URINE CULTURE

## 2018-04-01 LAB — CERVICOVAGINAL ANCILLARY ONLY
CANDIDA VAGINITIS: NEGATIVE
Chlamydia: NEGATIVE
NEISSERIA GONORRHEA: NEGATIVE

## 2018-04-02 MED ORDER — DOXYLAMINE-PYRIDOXINE ER 20-20 MG PO TBCR: 1.0000 | EXTENDED_RELEASE_TABLET | Freq: Every day | ORAL | 2 refills | Status: DC

## 2018-04-05 LAB — URINE DRUG PANEL 7
AMPHETAMINES, URINE: NEGATIVE ng/mL
BARBITURATE QUANT UR: NEGATIVE ng/mL
Benzodiazepine Quant, Ur: NEGATIVE ng/mL
COCAINE (METAB.): NEGATIVE ng/mL
Cannabinoid Quant, Ur: POSITIVE — AB
OPIATE QUANT UR: NEGATIVE ng/mL
PCP Quant, Ur: NEGATIVE ng/mL

## 2018-04-08 ENCOUNTER — Other Ambulatory Visit: Payer: BLUE CROSS/BLUE SHIELD

## 2018-04-08 ENCOUNTER — Ambulatory Visit (INDEPENDENT_AMBULATORY_CARE_PROVIDER_SITE_OTHER): Payer: BLUE CROSS/BLUE SHIELD

## 2018-04-08 ENCOUNTER — Ambulatory Visit (INDEPENDENT_AMBULATORY_CARE_PROVIDER_SITE_OTHER): Payer: BLUE CROSS/BLUE SHIELD | Admitting: Obstetrics and Gynecology

## 2018-04-08 VITALS — BP 108/56 | Wt 234.0 lb

## 2018-04-08 DIAGNOSIS — Z3A11 11 weeks gestation of pregnancy: Secondary | ICD-10-CM

## 2018-04-08 DIAGNOSIS — Z3480 Encounter for supervision of other normal pregnancy, unspecified trimester: Secondary | ICD-10-CM

## 2018-04-08 DIAGNOSIS — N8312 Corpus luteum cyst of left ovary: Secondary | ICD-10-CM

## 2018-04-08 DIAGNOSIS — Z369 Encounter for antenatal screening, unspecified: Secondary | ICD-10-CM

## 2018-04-08 DIAGNOSIS — Z3A10 10 weeks gestation of pregnancy: Secondary | ICD-10-CM

## 2018-04-08 DIAGNOSIS — Z31438 Encounter for other genetic testing of female for procreative management: Secondary | ICD-10-CM

## 2018-04-08 DIAGNOSIS — Z1379 Encounter for other screening for genetic and chromosomal anomalies: Secondary | ICD-10-CM

## 2018-04-08 DIAGNOSIS — O3481 Maternal care for other abnormalities of pelvic organs, first trimester: Secondary | ICD-10-CM

## 2018-04-08 DIAGNOSIS — Z6836 Body mass index (BMI) 36.0-36.9, adult: Secondary | ICD-10-CM

## 2018-04-08 DIAGNOSIS — O34219 Maternal care for unspecified type scar from previous cesarean delivery: Secondary | ICD-10-CM

## 2018-04-08 NOTE — Progress Notes (Signed)
Routine Prenatal Care Visit  Subjective  Erika Wiley is a 28 y.o. G2P1001 at [redacted]w[redacted]d being seen today for ongoing prenatal care.  She is currently monitored for the following issues for this low-risk pregnancy and has Previous cesarean delivery affecting pregnancy, antepartum; Supervision of other normal pregnancy, antepartum; and BMI 36.0-36.9,adult on their problem list.  ----------------------------------------------------------------------------------- Patient reports no complaints.   Contractions: Not present. Vag. Bleeding: None.  Movement: Absent. Denies leaking of fluid.  ----------------------------------------------------------------------------------- The following portions of the patient's history were reviewed and updated as appropriate: allergies, current medications, past family history, past medical history, past social history, past surgical history and problem list. Problem list updated.   Objective  Blood pressure (!) 108/56, weight 234 lb (106.1 kg), last menstrual period 01/25/2018. Pregravid weight 222 lb (100.7 kg) Total Weight Gain 12 lb (5.443 kg)  Body mass index is 38.94 kg/m.  Urinalysis:      Fetal Status: Fetal Heart Rate (bpm): 180   Movement: Absent     General:  Alert, oriented and cooperative. Patient is in no acute distress.  Skin: Skin is warm and dry. No rash noted.   Cardiovascular: Normal heart rate noted  Respiratory: Normal respiratory effort, no problems with respiration noted  Abdomen: Soft, gravid, appropriate for gestational age. Pain/Pressure: Absent     Pelvic:  Cervical exam deferred        Extremities: Normal range of motion.     ental Status: Normal mood and affect. Normal behavior. Normal judgment and thought content.   US Ob Comp Less 14 Wks  Result Date: 04/08/2018 Patient Name: Erika Wiley DOB: [birth year] MRN: [id number] ULTRASOUND REPORT Location: [facility] Date of Service: 04/08/2018 Indications:dating Findings:  Mason Jim intrauterine pregnancy is visualized with a CRL consistent with [redacted]w[redacted]d gestation, giving an (U/S) EDD of 10/27/18. The (U/S) EDD is consistent with the clinically established EDD of 11/01/18. FHR: 184 BPM CRL measurement: 41.6 mm Yolk sac is visualized and appears normal and early anatomy is normal. Amnion: visualized and appears normal SCH measuring 2.9 x 2.2 x 1.8cm (uterus RT). Right Ovary is normal in appearance. Left Ovary is normal appearance. Corpus luteal cyst:  Left ovary Survey of the adnexa demonstrates no adnexal masses. There is no free peritoneal fluid in the cul de sac. Impression: 1. [redacted]w[redacted]d Viable Singleton Intrauterine pregnancy by U/S. 2. (U/S) EDD is consistent with Clinically established EDD of 11/01/18. 3. Subchorionic hemorrhage. Recommendations: 1.Clinical correlation with the patient's History and Physical Exam. Darlina Guys, RDMS RVT There is a viable singleton gestation.  The fetal biometry correlates with established dating. Detailed evaluation of the fetal anatomy is precluded by early gestational age.  It must be noted that a normal ultrasound particular at this early gestational age is unable to rule out fetal aneuploidy, risk of first trimester miscarriage, or anatomic birth defects. Vena Austria, MD, FACOG [facility], [facility] 04/08/2018, 9:53 AM     Assessment   27 y.o. G2P1001 at [redacted]w[redacted]d by  11/01/2018, by Last Menstrual Period presenting for routine prenatal visit  Plan   pregnancy2 Problems (from 01/25/18 to present)    Problem Noted Resolved   Supervision of other normal pregnancy, antepartum 03/29/2018 by Oswaldo Conroy, CNM No   Overview Addendum 04/08/2018 10:07 PM by Vena Austria, MD    Clinic [facility] Prenatal [facility]  Dating LMP = [redacted]w[redacted]d [place] Blood type:     Genetic Screen Declined 04/08/2018 Antibody:   Anatomic [place]  Rubella:  Varicella:    GTT Early:               Third trimester:  RPR:     Rhogam  HBsAg:     TDaP vaccine                        Flu Shot: HIV:     Baby Food                                GBS:   Contraception  Pap: 06/07/2016 NIL  CBB     CS/VBAC    Support Person                  Gestational age appropriate obstetric precautions including but not limited to vaginal bleeding, contractions, leaking of fluid and fetal movement were reviewed in detail with the patient.    - early 1-hr and NOB labs today  Return in about 4 weeks (around 05/06/2018) for ROB.   Vena Austria, MD, Evern Core [facility], [facility] 04/08/2018, 10:35 AM

## 2018-04-08 NOTE — Progress Notes (Signed)
ROB GTT  Ultrasound

## 2018-04-09 ENCOUNTER — Telehealth: Payer: Self-pay

## 2018-04-09 NOTE — Telephone Encounter (Signed)
Melba from [facility] calling to confirm contact information.  [phone]  They have tried three different days and each time 'mailbox is full'.  If pt calls please have her call them.

## 2018-04-10 LAB — GLUCOSE, 1 HOUR GESTATIONAL: GESTATIONAL DIABETES SCREEN: 118 mg/dL (ref 65–139)

## 2018-04-10 LAB — RPR+RH+ABO+RUB AB+AB SCR+CB...
Antibody Screen: NEGATIVE
HEMATOCRIT: 33.8 % — AB (ref 34.0–46.6)
HEP B S AG: NEGATIVE
HIV Screen 4th Generation wRfx: NONREACTIVE
Hemoglobin: 11.6 g/dL (ref 11.1–15.9)
MCH: 29.5 pg (ref 26.6–33.0)
MCHC: 34.3 g/dL (ref 31.5–35.7)
MCV: 86 fL (ref 79–97)
Platelets: 298 10*3/uL (ref 150–450)
RBC: 3.93 x10E6/uL (ref 3.77–5.28)
RDW: 13.4 % (ref 11.7–15.4)
RH TYPE: POSITIVE
RPR Ser Ql: NONREACTIVE
Rubella Antibodies, IGG: 1.19 index (ref >0.99)
VARICELLA: 706 {index} (ref >165)
WBC: 7 10*3/uL (ref 3.4–10.8)

## 2018-04-10 LAB — HEMOGLOBINOPATHY EVALUATION
HGB C: 0 %
HGB S: 0 %
HGB VARIANT: 0 %
Hemoglobin A2 Quantitation: 2.2 % (ref 1.8–3.2)
Hemoglobin F Quantitation: 0 % (ref 0.0–2.0)
Hgb A: 97.8 % (ref 96.4–98.8)

## 2018-05-06 ENCOUNTER — Ambulatory Visit (INDEPENDENT_AMBULATORY_CARE_PROVIDER_SITE_OTHER): Payer: Medicaid Other | Admitting: Obstetrics and Gynecology

## 2018-05-06 ENCOUNTER — Other Ambulatory Visit: Payer: Self-pay

## 2018-05-06 ENCOUNTER — Encounter: Payer: Self-pay | Admitting: Obstetrics and Gynecology

## 2018-05-06 VITALS — BP 120/70 | Wt 240.0 lb

## 2018-05-06 DIAGNOSIS — Z3A14 14 weeks gestation of pregnancy: Secondary | ICD-10-CM

## 2018-05-06 DIAGNOSIS — O34219 Maternal care for unspecified type scar from previous cesarean delivery: Secondary | ICD-10-CM

## 2018-05-06 DIAGNOSIS — Z6836 Body mass index (BMI) 36.0-36.9, adult: Secondary | ICD-10-CM

## 2018-05-06 DIAGNOSIS — Z3480 Encounter for supervision of other normal pregnancy, unspecified trimester: Secondary | ICD-10-CM

## 2018-05-06 NOTE — Progress Notes (Signed)
    Routine Prenatal Care Visit  Subjective  Erika Wiley is a 28 y.o. G2P1001 at [redacted]w[redacted]d being seen today for ongoing prenatal care.  She is currently monitored for the following issues for this low-risk pregnancy and has Previous cesarean delivery affecting pregnancy, antepartum; Supervision of other normal pregnancy, antepartum; and BMI 36.0-36.9,adult on their problem list.  ----------------------------------------------------------------------------------- Patient reports no complaints.   Contractions: Not present. Vag. Bleeding: None.  Movement: Absent. Denies leaking of fluid.  ----------------------------------------------------------------------------------- The following portions of the patient's history were reviewed and updated as appropriate: allergies, current medications, past family history, past medical history, past social history, past surgical history and problem list. Problem list updated.   Objective  Blood pressure 120/70, weight 240 lb (108.9 kg), last menstrual period 01/25/2018. Pregravid weight 222 lb (100.7 kg) Total Weight Gain 18 lb (8.165 kg) Urinalysis:      Fetal Status: Fetal Heart Rate (bpm): 155   Movement: Absent     General:  Alert, oriented and cooperative. Patient is in no acute distress.  Skin: Skin is warm and dry. No rash noted.   Cardiovascular: Normal heart rate noted  Respiratory: Normal respiratory effort, no problems with respiration noted  Abdomen: Soft, gravid, appropriate for gestational age. Pain/Pressure: Absent     Pelvic:  Cervical exam deferred        Extremities: Normal range of motion.     Mental Status: Normal mood and affect. Normal behavior. Normal judgment and thought content.    Assessment   28 y.o. G2P1001 at [redacted]w[redacted]d by  11/01/2018, by Last Menstrual Period presenting for routine prenatal visit  Plan   pregnancy2 Problems (from 01/25/18 to present)    Problem Noted Resolved   Supervision of other normal pregnancy,  antepartum 03/29/2018 by Oswaldo Conroy, CNM No   Overview Addendum 05/06/2018  2:33 PM by Natale Milch, MD    Clinic [facility] Prenatal Labs  Dating LMP = [redacted]w[redacted]d [place] Blood type: B/Positive/-- (03/02 1009)   Genetic Screen Declined 04/08/2018 Antibody:Negative (03/02 1009)  Anatomic [place]  Rubella: 1.19 (03/02 1009) Varicella:  Immune  GTT Early:  118             Third trimester:  RPR: Non Reactive (03/02 1009)   Rhogam  Positive HBsAg: Negative (03/02 1009)   TDaP vaccine                        Flu Shot:Declines HIV: Non Reactive (03/02 1009)   Baby Food                                GBS:   Contraception  Pap:06/07/2016 NIL   CBB     CS/VBAC  Desires repeat C/S   Support Person                  Gestational age appropriate obstetric precautions including but not limited to vaginal bleeding, contractions, leaking of fluid and fetal movement were reviewed in detail with the patient.    Declines flu shot Discussed [facility] and FAQ because of COVID-19 pandemic  Return in about 6 weeks (around 06/17/2018) for ROB and [place].  Natale Milch MD [facility], [facility] 05/06/2018, 2:33 PM

## 2018-05-06 NOTE — Patient Instructions (Signed)
Hello,  Given the current COVID-19 pandemic, our practice is making changes in how we are providing care to our patients. We are limiting in-person visits for the safety of all of our patients.   As a practice, we have met to discuss the best way to minimize visits, but still provide excellent care to our expecting mothers.  We have decided on the following visit structure for low-risk pregnancies.  Initial Pregnancy visit will be conducted as a telephone or web visit.  Between 10-14 weeks  there will be one in-person visit for an ultrasound, lab work, and genetic screening. 20 weeks in-person visit with an anatomy ultrasound  28 weeks in-person office visit for a 1-hour glucose test and a TDAP vaccination 32 weeks in-person office visit 34 weeks telephone visit 36 weeks in-person office visit for GBS, chlamydia, and gonorrhea testing 38 weeks in-person office visit 40 weeks in-person office visit  Understandably, some patients will require more visits than what is outlined above. Additional visits will be determined on a case-by-case basis.   We will, as always, be available for emergencies or to address concerns that might arise between in-person visits. We ask that you allow [place] the opportunity to address any concerns over the phone or through a virtual visit first. We will be available to return your phone calls throughout the day.   If you are able to purchase a scale, a blood pressure machine, and a home fetal doppler visits could be limited further. This will help decrease your exposure risks, but these purchases are not a necessity.   Things seem to change daily and there is the possibility that this structure could change, please be patient as we adapt to a new way of caring for patients.   Thank you for trusting [place] with your prenatal care. Our practice values you and looks forward to providing you with excellent care.   Sincerely,   [facility], [place]      COVID-19 and Your Pregnancy FAQ  How can I prevent infection with COVID-19 during my pregnancy? Social distancing is key. Please limit any interactions in public. Try and work from home if possible. Frequently wash your hands after touching possibly contaminated surfaces. Avoid touching your face.  Minimize trips to the store. Consider online ordering when possible.   Should I wear a mask? Masks should only be worn by those experiencing symptoms of COVID-19 or those with confirmed COVID-19 when they are in public or around other individuals.  What are the symptoms of COVID-19? Fever (greater than 100.4 F), dry cough, shortness of breath.  Am I more at risk for COVID-19 since I am pregnant? There is not currently data showing that pregnant women are more adversely impacted by COVID-19 than the general population. However, we know that pregnant women tend to have worse respiratory complications from similar diseases such as the flu and SARS and for this reason should be considered an at-risk population.  What do I do if I am experiencing the symptoms of COVID-19? Testing is being limited because of test availability. If you are experiencing symptoms you should quarantine yourself, and the members of your family, for at least 2 weeks at home.   Please visit this website for more information: [email or web address]  When should I go to the Emergency Room? Please go to the emergency room if you are experiencing ANY of these symptoms*:  1.    Difficulty breathing or shortness of breath 2.  Persistent pain or pressure in the chest 3.    Confusion or difficulty being aroused (or awakened) 4.    Bluish lips or face  *This list is not all inclusive. Please consult our office for any other symptoms that are severe or concerning.  What do I do if I am having difficulty breathing? You should go to the Emergency Room for  evaluation. At this time they have a tent set up for evaluating patients with COVID-19 symptoms.   How will my prenatal care be different because of the COVID-19 pandemic? It has been recommended to reduce the frequency of face-to-face visits and use resources such as telephone and virtual visits when possible. Using a scale, blood pressure machine and fetal doppler at home can further help reduce face-to-face visits. You will be provided with additional information on this topic.  We ask that you come to your visits alone to minimize potential exposures to  COVID-19.  How can I receive childbirth education? At this time in-person classes have been cancelled. You can register for online childbirth education, breastfeeding, and newborn care classes.  Please visit:  [email or web address] for more information  How will my hospital birth experience be different? The hospital is currently limiting visitors. This means that while you are in labor you can only have one person at the hospital with you. Additional family members will not be allowed to wait in the building or outside your room. Your one support person can be the father of the baby, a relative, a doula, or a friend. Once one support person is designated that person will wear a band. This band cannot be shared with multiple people.  How long will I stay in the hospital for after giving birth? It is also recommended that discharge home be expedited during the COVID-19 outbreak. This means staying for 1 day after a vaginal delivery and 2 days after a cesarean section.  What if I have COVID-19 and I am in labor? We ask that you wear a mask while on labor and delivery. We will try and accommodate you being placed in a room that is capable of filtering the air. Please call ahead if you are in labor and on your way to the hospital. The phone number for labor and delivery at [facility] is [phone].  If I have  COVID-19 when my baby is born how can I prevent my baby from contracting COVID-19? This is an issue that will have to be discussed on a case-by-case basis. Current recommendations suggest providing separate isolation rooms for both the mother and new infant as well as limiting visitors. However, there are practical challenges to this recommendation. The situation will assuredly change and decisions will be influenced by the desires of the mother and availability of space.  Some suggestions are the use of a curtain or physical barrier between mom and infant, hand hygiene, mom wearing a mask, or 6 feet of spacing between a mom and infant.   Can I breastfeed during the COVID-19 pandemic?   Yes, breastfeeding is encouraged.  Can I breastfeed if I have COVID-19? Yes. Covid-19 has not been found in breast milk. This means you cannot give COVID-19 to your child through breast milk. Breast feeding will also help pass antibodies to fight infection to your baby.   What precautions should I take when breastfeeding if I have COVID-19? If a mother and newborn do room-in and the mother wishes to feed at the breast, she should   put on a facemask and practice hand hygiene before each feeding.  What precautions should I take when pumping if I have COVID-19? Prior to expressing breast milk, mothers should practice hand hygiene. After each pumping session, all parts that come into contact with breast milk should be thoroughly washed and the entire pump should be appropriately disinfected per the manufacturer's instructions. This expressed breast milk should be fed to the newborn by a healthy caregiver.  What if I am pregnant and work in healthcare? Based on limited data regarding COVID-19 and pregnancy, ACOG currently does not propose creating additional restrictions on pregnant health care personnel because of COVID-19 alone. Pregnant women do not appear to be at higher risk of severe disease related to COVID-19.  Pregnant health care personnel should follow CDC risk assessment and infection control guidelines for health care personnel exposed to patients with suspected or confirmed COVID-19. Adherence to recommended infection prevention and control practices is an important part of protecting all health care personnel in health care settings.    Information on COVID-19 in pregnancy is very limited; however, facilities may want to consider limiting exposure of pregnant health care personnel to patients with confirmed or suspected COVID-19 infection, especially during higher-risk procedures (eg, aerosol-generating procedures), if feasible, based on staffing availability.

## 2018-05-06 NOTE — Progress Notes (Signed)
ROB Declines genetic testing  No concerns

## 2018-05-14 ENCOUNTER — Telehealth: Payer: Self-pay

## 2018-05-14 NOTE — Telephone Encounter (Signed)
[place] from [employer] calling to verify pt contact information.  [phone] opt 1.  Spoke to [place].  Phone #s given, email given, address verified.

## 2018-06-17 ENCOUNTER — Encounter: Payer: Medicaid Other | Admitting: Obstetrics and Gynecology

## 2018-06-17 ENCOUNTER — Other Ambulatory Visit: Payer: Self-pay

## 2018-06-17 ENCOUNTER — Encounter: Payer: Self-pay | Admitting: Maternal Newborn

## 2018-06-17 ENCOUNTER — Ambulatory Visit (INDEPENDENT_AMBULATORY_CARE_PROVIDER_SITE_OTHER): Payer: Medicaid Other | Admitting: Maternal Newborn

## 2018-06-17 ENCOUNTER — Ambulatory Visit (INDEPENDENT_AMBULATORY_CARE_PROVIDER_SITE_OTHER): Payer: Medicaid Other

## 2018-06-17 VITALS — BP 114/66 | Wt 254.0 lb

## 2018-06-17 DIAGNOSIS — Z3480 Encounter for supervision of other normal pregnancy, unspecified trimester: Secondary | ICD-10-CM

## 2018-06-17 DIAGNOSIS — Z363 Encounter for antenatal screening for malformations: Secondary | ICD-10-CM

## 2018-06-17 DIAGNOSIS — Z3A20 20 weeks gestation of pregnancy: Secondary | ICD-10-CM

## 2018-06-17 DIAGNOSIS — O34219 Maternal care for unspecified type scar from previous cesarean delivery: Secondary | ICD-10-CM

## 2018-06-17 LAB — POCT URINALYSIS DIPSTICK OB
Glucose, UA: NEGATIVE
POC,PROTEIN,UA: NEGATIVE

## 2018-06-17 NOTE — Patient Instructions (Signed)

## 2018-06-17 NOTE — Progress Notes (Signed)
    Routine Prenatal Care Visit  Subjective  Erika Wiley is a 28 y.o. G2P1001 at [redacted]w[redacted]d being seen today for ongoing prenatal care.  She is currently monitored for the following issues for this low-risk pregnancy and has Previous cesarean delivery affecting pregnancy, antepartum; Supervision of other normal pregnancy, antepartum; and BMI 36.0-36.9,adult on their problem list.  ----------------------------------------------------------------------------------- Patient reports no complaints.   Contractions: Not present. Vag. Bleeding: None.  Movement: Present. No leaking of fluid.  ----------------------------------------------------------------------------------- The following portions of the patient's history were reviewed and updated as appropriate: allergies, current medications, past family history, past medical history, past social history, past surgical history and problem list. Problem list updated.  Objective  Blood pressure 114/66, weight 254 lb (115.2 kg), last menstrual period 01/25/2018. Pregravid weight 222 lb (100.7 kg) Total Weight Gain 32 lb (14.5 kg) Urinalysis: Urine dipstick shows negative for glucose, protein. Fetal Status: Fetal Heart Rate (bpm): 157   Movement: Present     General:  Alert, oriented and cooperative. Patient is in no acute distress.  Skin: Skin is warm and dry. No rash noted.   Cardiovascular: Normal heart rate noted  Respiratory: Normal respiratory effort, no problems with respiration noted  Abdomen: Soft, gravid, appropriate for gestational age. Pain/Pressure: Absent     Pelvic:  Cervical exam deferred        Extremities: Normal range of motion.     Mental Status: Normal mood and affect. Normal behavior. Normal judgment and thought content.    Assessment   27 y.o. G2P1001 at [redacted]w[redacted]d, EDD 11/01/2018 by Last Menstrual Period presenting for a routine prenatal visit.  Plan   pregnancy2 Problems (from 01/25/18 to present)    Problem Noted Resolved    Supervision of other normal pregnancy, antepartum 03/29/2018 by Oswaldo Conroy, CNM No   Overview Addendum 05/06/2018  2:34 PM by Natale Milch, MD    Clinic [facility] Prenatal Labs  Dating LMP = [redacted]w[redacted]d [place] Blood type: B/Positive/-- (03/02 1009)   Genetic Screen Declined 04/08/2018 Antibody:Negative (03/02 1009)  Anatomic [place]  Rubella: 1.19 (03/02 1009) Varicella:  Immune  GTT Early:  118             Third trimester:  RPR: Non Reactive (03/02 1009)   Rhogam  Positive HBsAg: Negative (03/02 1009)   TDaP vaccine                        Flu Shot:Declines HIV: Non Reactive (03/02 1009)   Baby Food   Bottle fed other child     GBS:   Contraception  Pap:06/07/2016 NIL   CBB     CS/VBAC  Desires repeat C/S   Support Person               Anatomy scan today, images reviewed but no report available. Will notify patient if follow up scan is needed after MD review.   Please refer to After Visit Summary for other counseling recommendations.   Return in about 4 weeks (around 07/15/2018) for ROB.  [name], CNM 06/17/2018

## 2018-06-17 NOTE — Progress Notes (Signed)
Anatomy scan today. No complaints. 

## 2018-07-15 ENCOUNTER — Encounter: Payer: Self-pay | Admitting: Obstetrics and Gynecology

## 2018-07-15 ENCOUNTER — Ambulatory Visit (INDEPENDENT_AMBULATORY_CARE_PROVIDER_SITE_OTHER): Payer: Medicaid Other | Admitting: Obstetrics and Gynecology

## 2018-07-15 ENCOUNTER — Other Ambulatory Visit: Payer: Self-pay

## 2018-07-15 VITALS — BP 118/78 | Wt 257.0 lb

## 2018-07-15 DIAGNOSIS — Z131 Encounter for screening for diabetes mellitus: Secondary | ICD-10-CM

## 2018-07-15 DIAGNOSIS — O34219 Maternal care for unspecified type scar from previous cesarean delivery: Secondary | ICD-10-CM

## 2018-07-15 DIAGNOSIS — Z3480 Encounter for supervision of other normal pregnancy, unspecified trimester: Secondary | ICD-10-CM

## 2018-07-15 DIAGNOSIS — Z6836 Body mass index (BMI) 36.0-36.9, adult: Secondary | ICD-10-CM

## 2018-07-15 DIAGNOSIS — Z113 Encounter for screening for infections with a predominantly sexual mode of transmission: Secondary | ICD-10-CM

## 2018-07-15 DIAGNOSIS — Z3A24 24 weeks gestation of pregnancy: Secondary | ICD-10-CM

## 2018-07-15 NOTE — Progress Notes (Signed)
  Routine Prenatal Care Visit  Subjective  Erika Wiley is a 27 y.o. G2P1001 at [redacted]w[redacted]d being seen today for ongoing prenatal care.  She is currently monitored for the following issues for this high-risk pregnancy and has Previous cesarean delivery affecting pregnancy, antepartum; Supervision of other normal pregnancy, antepartum; and BMI 36.0-36.9,adult on their problem list.  ----------------------------------------------------------------------------------- Patient reports no complaints.   Contractions: Not present. Vag. Bleeding: None.  Movement: Present. Denies leaking of fluid.  ----------------------------------------------------------------------------------- The following portions of the patient's history were reviewed and updated as appropriate: allergies, current medications, past family history, past medical history, past social history, past surgical history and problem list. Problem list updated.   Objective  Blood pressure 118/78, weight 257 lb (116.6 kg), last menstrual period 01/25/2018. Pregravid weight 222 lb (100.7 kg) Total Weight Gain 35 lb (15.9 kg) Urinalysis: Urine Protein    Urine Glucose    Fetal Status: Fetal Heart Rate (bpm): 145 Fundal Height: 26 cm Movement: Present     General:  Alert, oriented and cooperative. Patient is in no acute distress.  Skin: Skin is warm and dry. No rash noted.   Cardiovascular: Normal heart rate noted  Respiratory: Normal respiratory effort, no problems with respiration noted  Abdomen: Soft, gravid, appropriate for gestational age. Pain/Pressure: Absent     Pelvic:  Cervical exam deferred        Extremities: Normal range of motion.     Mental Status: Normal mood and affect. Normal behavior. Normal judgment and thought content.   Assessment   28 y.o. G2P1001 at [redacted]w[redacted]d by  11/01/2018, by Last Menstrual Period presenting for routine prenatal visit  Plan   pregnancy2 Problems (from 01/25/18 to present)    Problem Noted Resolved    Supervision of other normal pregnancy, antepartum 03/29/2018 by Rexene Agent, CNM No   Overview Addendum 05/06/2018  2:34 PM by [name], MD    [facility]inic [facility] Prenatal Labs  Dating LMP = [redacted]w[redacted]d [place] Blood type: B/Positive/-- (03/02 1009)   Genetic Screen Declined 04/08/2018 Antibody:Negative (03/02 1009)  Anatomic [place]  Rubella: 1.19 (03/02 1009) Varicella:  Immune  GTT Early:  77             Third trimester:  RPR: Non Reactive (03/02 1009)   Rhogam  Positive HBsAg: Negative (03/02 1009)   TDaP vaccine                        Flu Shot:Declines HIV: Non Reactive (03/02 1009)   Baby Food   Bottle fed other child     GBS:   Contraception  Pap:06/07/2016 NIL   CBB     CS/VBAC  Desires repeat C/S   Support Person                  Preterm labor symptoms and general obstetric precautions including but not limited to vaginal bleeding, contractions, leaking of fluid and fetal movement were reviewed in detail with the patient. Please refer to After Visit Summary for other counseling recommendations.   - 28 week labs nv - schedule c-section next visit  Return in about 3 weeks (around 08/05/2018) for 28 week labs, routine prenatal visit.  Prentice Docker, MD, [facility], [facility] 07/15/2018 2:01 PM

## 2018-08-05 ENCOUNTER — Encounter: Payer: Self-pay | Admitting: Obstetrics and Gynecology

## 2018-08-05 ENCOUNTER — Other Ambulatory Visit: Payer: Medicaid Other

## 2018-08-05 ENCOUNTER — Ambulatory Visit (INDEPENDENT_AMBULATORY_CARE_PROVIDER_SITE_OTHER): Payer: Medicaid Other | Admitting: Obstetrics and Gynecology

## 2018-08-05 ENCOUNTER — Other Ambulatory Visit: Payer: Self-pay

## 2018-08-05 VITALS — BP 120/82 | Wt 260.0 lb

## 2018-08-05 DIAGNOSIS — Z113 Encounter for screening for infections with a predominantly sexual mode of transmission: Secondary | ICD-10-CM

## 2018-08-05 DIAGNOSIS — Z3480 Encounter for supervision of other normal pregnancy, unspecified trimester: Secondary | ICD-10-CM

## 2018-08-05 DIAGNOSIS — Z3A28 28 weeks gestation of pregnancy: Secondary | ICD-10-CM

## 2018-08-05 DIAGNOSIS — Z131 Encounter for screening for diabetes mellitus: Secondary | ICD-10-CM

## 2018-08-05 DIAGNOSIS — O34219 Maternal care for unspecified type scar from previous cesarean delivery: Secondary | ICD-10-CM

## 2018-08-05 DIAGNOSIS — O26843 Uterine size-date discrepancy, third trimester: Secondary | ICD-10-CM

## 2018-08-05 NOTE — Progress Notes (Signed)
    Routine Prenatal Care Visit  Subjective  Erika Wiley is a 28 y.o. G2P1001 at [redacted]w[redacted]d being seen today for ongoing prenatal care.  She is currently monitored for the following issues for this low-risk pregnancy and has Previous cesarean delivery affecting pregnancy, antepartum; Supervision of other normal pregnancy, antepartum; and BMI 36.0-36.9,adult on their problem list.  ----------------------------------------------------------------------------------- Patient reports no complaints.   Contractions: Not present. Vag. Bleeding: None.  Movement: Present. Denies leaking of fluid.  ----------------------------------------------------------------------------------- The following portions of the patient's history were reviewed and updated as appropriate: allergies, current medications, past family history, past medical history, past social history, past surgical history and problem list. Problem list updated.   Objective  Blood pressure 120/82, weight 260 lb (117.9 kg), last menstrual period 01/25/2018. Pregravid weight 222 lb (100.7 kg) Total Weight Gain 38 lb (17.2 kg) Urinalysis:      Fetal Status: Fetal Heart Rate (bpm): 141 Fundal Height: 32 cm Movement: Present     General:  Alert, oriented and cooperative. Patient is in no acute distress.  Skin: Skin is warm and dry. No rash noted.   Cardiovascular: Normal heart rate noted  Respiratory: Normal respiratory effort, no problems with respiration noted  Abdomen: Soft, gravid, appropriate for gestational age. Pain/Pressure: Absent     Pelvic:  Cervical exam deferred        Extremities: Normal range of motion.  Edema: None  Mental Status: Normal mood and affect. Normal behavior. Normal judgment and thought content.     Assessment   28 y.o. G2P1001 at [redacted]w[redacted]d by  11/01/2018, by Last Menstrual Period presenting for routine prenatal visit  Plan   pregnancy2 Problems (from 01/25/18 to present)    Problem Noted Resolved   Supervision of other normal pregnancy, antepartum 03/29/2018 by Rexene Agent, CNM No   Overview Addendum 08/05/2018 11:11 AM by [name], MD    [facility]inic [facility] Prenatal Labs  Dating LMP = [redacted]w[redacted]d [place] Blood type: B/Positive/-- (03/02 1009)   Genetic Screen Declined 04/08/2018 Antibody:Negative (03/02 1009)  Anatomic [place] complete Rubella: 1.19 (03/02 1009) Varicella:  Immune  GTT Early:  1             Third trimester:  RPR: Non Reactive (03/02 1009)   Rhogam  Positive HBsAg: Negative (03/02 1009)   TDaP vaccine                        Flu Shot:Declines HIV: Non Reactive (03/02 1009)   Baby Food   Bottle fed other child     GBS:   Contraception  None Pap:06/07/2016 NIL   CBB     CS/VBAC  Desires repeat C/S Scheduled 10/29/2018   Support Person                  Gestational age appropriate obstetric precautions including but not limited to vaginal bleeding, contractions, leaking of fluid and fetal movement were reviewed in detail with the patient.    Scheduled for cesarean with Dr. Georgianne Fick 10/29/2018 Growth [place] at next visit for uterine size > dates Declines birth control  Return in about 2 weeks (around 08/19/2018) for [place] in person and [place].  [name] MD [facility], [facility] 08/05/2018, 11:14 AM

## 2018-08-05 NOTE — Progress Notes (Signed)
ROB/28 week labs No concerns  Denies lof, no vb, Good FM 

## 2018-08-06 LAB — 28 WEEK RH+PANEL
Basophils Absolute: 0 10*3/uL (ref 0.0–0.2)
Basos: 0 %
EOS (ABSOLUTE): 0.1 10*3/uL (ref 0.0–0.4)
Eos: 1 %
Gestational Diabetes Screen: 77 mg/dL (ref 65–139)
HIV Screen 4th Generation wRfx: NONREACTIVE
Hematocrit: 34 % (ref 34.0–46.6)
Hemoglobin: 11.1 g/dL (ref 11.1–15.9)
Immature Grans (Abs): 0.1 10*3/uL (ref 0.0–0.1)
Immature Granulocytes: 1 %
Lymphocytes Absolute: 2.2 10*3/uL (ref 0.7–3.1)
Lymphs: 29 %
MCH: 28.5 pg (ref 26.6–33.0)
MCHC: 32.6 g/dL (ref 31.5–35.7)
MCV: 87 fL (ref 79–97)
Monocytes Absolute: 0.7 10*3/uL (ref 0.1–0.9)
Monocytes: 10 %
Neutrophils Absolute: 4.6 10*3/uL (ref 1.4–7.0)
Neutrophils: 59 %
Platelets: 289 10*3/uL (ref 150–450)
RBC: 3.89 x10E6/uL (ref 3.77–5.28)
RDW: 12.5 % (ref 11.7–15.4)
RPR Ser Ql: NONREACTIVE
WBC: 7.7 10*3/uL (ref 3.4–10.8)

## 2018-08-07 ENCOUNTER — Telehealth: Payer: Self-pay | Admitting: Obstetrics and Gynecology

## 2018-08-07 NOTE — Telephone Encounter (Signed)
-----   Message from [name], MD sent at 08/05/2018 11:08 AM EDT ----- Surgery Booking Request Patient Full Name:  Erika Wiley  MRN: [id number]  DOB: 09/17/1990  Surgeon: [name], MD  Requested Surgery Date and Time: 10/29/2018 Primary Diagnosis AND Code: [redacted] week gestation, hx of prior LTCS Secondary Diagnosis and Code:  Surgical Procedure: Cesarean Section L&D Notification: Yes Admission Status: surgery admit Length of Surgery: 2 hours Special Case Needs: none H&P: TBD (date) Phone Interview???: no Interpreter: Language:  Medical Clearance: no Special Scheduling Instructions: non2 Acuity: P1

## 2018-08-07 NOTE — Telephone Encounter (Signed)
Patient is aware of H&P on 10/22/18 @ 10:50am w/ Dr. Georgianne Fick, Preadmit Testing to be scheduled, COVID testing on 10/25/18, and OR on 10/29/18. Patient is aware she will be asked to quarantine after COVID testing.

## 2018-08-07 NOTE — Telephone Encounter (Signed)
Lmtrc

## 2018-08-19 ENCOUNTER — Ambulatory Visit (INDEPENDENT_AMBULATORY_CARE_PROVIDER_SITE_OTHER): Payer: Medicaid Other | Admitting: Obstetrics and Gynecology

## 2018-08-19 ENCOUNTER — Other Ambulatory Visit: Payer: Self-pay

## 2018-08-19 ENCOUNTER — Ambulatory Visit (INDEPENDENT_AMBULATORY_CARE_PROVIDER_SITE_OTHER): Payer: Medicaid Other

## 2018-08-19 ENCOUNTER — Encounter: Payer: Self-pay | Admitting: Obstetrics and Gynecology

## 2018-08-19 VITALS — BP 124/80 | Wt 260.0 lb

## 2018-08-19 DIAGNOSIS — O26843 Uterine size-date discrepancy, third trimester: Secondary | ICD-10-CM

## 2018-08-19 DIAGNOSIS — Z362 Encounter for other antenatal screening follow-up: Secondary | ICD-10-CM

## 2018-08-19 DIAGNOSIS — Z6836 Body mass index (BMI) 36.0-36.9, adult: Secondary | ICD-10-CM

## 2018-08-19 DIAGNOSIS — Z23 Encounter for immunization: Secondary | ICD-10-CM | POA: Diagnosis not present

## 2018-08-19 DIAGNOSIS — O34219 Maternal care for unspecified type scar from previous cesarean delivery: Secondary | ICD-10-CM

## 2018-08-19 DIAGNOSIS — Z3480 Encounter for supervision of other normal pregnancy, unspecified trimester: Secondary | ICD-10-CM

## 2018-08-19 DIAGNOSIS — Z3A29 29 weeks gestation of pregnancy: Secondary | ICD-10-CM

## 2018-08-19 NOTE — Addendum Note (Signed)
Addended by: Cleophas Dunker D on: 08/19/2018 12:46 PM   Modules accepted: Orders

## 2018-08-19 NOTE — Progress Notes (Signed)
Routine Prenatal Care Visit  Subjective  Erika Wiley is a 28 y.o. G2P1001 at [redacted]w[redacted]d being seen today for ongoing prenatal care.  She is currently monitored for the following issues for this high-risk pregnancy and has Previous cesarean delivery affecting pregnancy, antepartum; Supervision of other normal pregnancy, antepartum; and BMI 36.0-36.9,adult on their problem list.  ----------------------------------------------------------------------------------- Patient reports no complaints.   Contractions: Not present. Vag. Bleeding: None.  Movement: Present. Denies leaking of fluid.  Growth u/s 69th %ile, AFI nml ----------------------------------------------------------------------------------- The following portions of the patient's history were reviewed and updated as appropriate: allergies, current medications, past family history, past medical history, past social history, past surgical history and problem list. Problem list updated.   Objective  Blood pressure 124/80, weight 260 lb (117.9 kg), last menstrual period 01/25/2018. Pregravid weight 222 lb (100.7 kg) Total Weight Gain 38 lb (17.2 kg) Urinalysis: Urine Protein    Urine Glucose    Fetal Status: Fetal Heart Rate (bpm): 144   Movement: Present     General:  Alert, oriented and cooperative. Patient is in no acute distress.  Skin: Skin is warm and dry. No rash noted.   Cardiovascular: Normal heart rate noted  Respiratory: Normal respiratory effort, no problems with respiration noted  Abdomen: Soft, gravid, appropriate for gestational age. Pain/Pressure: Absent     Pelvic:  Cervical exam deferred        Extremities: Normal range of motion.  Edema: None  Mental Status: Normal mood and affect. Normal behavior. Normal judgment and thought content.   Imaging Results US Ob Follow Up  Result Date: 08/19/2018 Patient Name: Erika Wiley DOB: May 28, 1990 MRN: [id number] ULTRASOUND REPORT Location: [facility] Date of Service:  08/19/2018 Indications:growth/afi Findings: Erika Wiley intrauterine pregnancy is visualized with FHR at 144 BPM. Biometrics give an (U/S) Gestational age of [redacted]w[redacted]d and an (U/S) EDD of 10/22/2018; this correlates with the clinically established Estimated Date of Delivery: 11/01/2018.  Fetal presentation is Cephalic. Placenta: fundal. Grade: 2 AFI: 12.7 cm Growth percentile is 69.4. EFW: 1,615 g (3 lb 9 oz) Impression: 1. [redacted]w[redacted]d Viable Singleton Intrauterine pregnancy previously established criteria. 2. Growth is 69.4 %ile.  AFI is 12.7 cm. Gweneth Dimitri, RT The ultrasound images and findings were reviewed by me and I agree with the above report. Prentice Docker, MD, [facility], [facility] 08/19/2018 12:18 PM       Assessment   28 y.o. G2P1001 at [redacted]w[redacted]d by  11/01/2018, by Last Menstrual Period presenting for routine prenatal visit  Plan   pregnancy2 Problems (from 01/25/18 to present)    Problem Noted Resolved   Supervision of other normal pregnancy, antepartum 03/29/2018 by Rexene Agent, CNM No   Overview Addendum 08/05/2018 11:11 AM by [name], MD    [facility]inic [facility] Prenatal Labs  Dating LMP = [redacted]w[redacted]d [place] Blood type: B/Positive/-- (03/02 1009)   Genetic Screen Declined 04/08/2018 Antibody:Negative (03/02 1009)  Anatomic [place] complete Rubella: 1.19 (03/02 1009) Varicella:  Immune  GTT Early:  75             Third trimester:  RPR: Non Reactive (03/02 1009)   Rhogam  Positive HBsAg: Negative (03/02 1009)   TDaP vaccine                        Flu Shot:Declines HIV: Non Reactive (03/02 1009)   Baby Food   Bottle fed other child     GBS:   Contraception  None Pap:06/07/2016 NIL   CBB     CS/VBAC  Desires repeat C/S Scheduled 10/29/2018   Support Person                  Preterm labor symptoms and general obstetric precautions including but not limited to vaginal bleeding, contractions, leaking of fluid and fetal movement were reviewed in detail with the  patient. Please refer to After Visit Summary for other counseling recommendations.   - TDaP today  Return in about 2 weeks (around 09/02/2018) for Routine Prenatal Appointment/telephone.  Thomasene MohairStephen Cailan General, MD, Merlinda FrederickFACOG [facility], [facility] 08/19/2018 12:32 PM

## 2018-09-02 ENCOUNTER — Other Ambulatory Visit: Payer: Self-pay

## 2018-09-02 ENCOUNTER — Encounter: Payer: Self-pay | Admitting: Maternal Newborn

## 2018-09-02 ENCOUNTER — Ambulatory Visit (INDEPENDENT_AMBULATORY_CARE_PROVIDER_SITE_OTHER): Payer: Medicaid Other | Admitting: Maternal Newborn

## 2018-09-02 VITALS — BP 120/80 | Wt 266.0 lb

## 2018-09-02 DIAGNOSIS — Z3A31 31 weeks gestation of pregnancy: Secondary | ICD-10-CM

## 2018-09-02 DIAGNOSIS — Z3480 Encounter for supervision of other normal pregnancy, unspecified trimester: Secondary | ICD-10-CM

## 2018-09-02 DIAGNOSIS — O34219 Maternal care for unspecified type scar from previous cesarean delivery: Secondary | ICD-10-CM

## 2018-09-02 NOTE — Progress Notes (Signed)
    Routine Prenatal Care Visit  Subjective  Erika Wiley is a 28 y.o. G2P1001 at [redacted]w[redacted]d being seen today for ongoing prenatal care.  She is currently monitored for the following issues for this low-risk pregnancy and has Previous cesarean delivery affecting pregnancy, antepartum; Supervision of other normal pregnancy, antepartum; and BMI 36.0-36.9,adult on their problem list.  ----------------------------------------------------------------------------------- Patient reports no complaints.   Contractions: Not present. Vag. Bleeding: None.  Movement: Present. No leaking of fluid.  ----------------------------------------------------------------------------------- The following portions of the patient's history were reviewed and updated as appropriate: allergies, current medications, past family history, past medical history, past social history, past surgical history and problem list. Problem list updated.   Objective  Blood pressure 120/80, weight 266 lb (120.7 kg), last menstrual period 01/25/2018. Pregravid weight 222 lb (100.7 kg) Total Weight Gain 44 lb (20 kg)  Fetal Status: Fetal Heart Rate (bpm): 154 Fundal Height: 34 cm Movement: Present     General:  Alert, oriented and cooperative. Patient is in no acute distress.  Skin: Skin is warm and dry. No rash noted.   Cardiovascular: Normal heart rate noted  Respiratory: Normal respiratory effort, no problems with respiration noted  Abdomen: Soft, gravid, appropriate for gestational age. Pain/Pressure: Absent     Pelvic:  Cervical exam deferred        Extremities: Normal range of motion.  Edema: None  Mental Status: Normal mood and affect. Normal behavior. Normal judgment and thought content.     Assessment   28 y.o. G2P1001 at [redacted]w[redacted]d, EDD 11/01/2018 by Last Menstrual Period presenting for a routine prenatal visit.  Plan   pregnancy2 Problems (from 01/25/18 to present)    Problem Noted Resolved   Supervision of other normal  pregnancy, antepartum 03/29/2018 by Rexene Agent, CNM No   Overview Addendum 08/05/2018 11:11 AM by [name], MD    Clinic [facility] Prenatal Labs  Dating LMP = [redacted]w[redacted]d [place] Blood type: B/Positive/-- (03/02 1009)   Genetic Screen Declined 04/08/2018 Antibody:Negative (03/02 1009)  Anatomic [place] complete Rubella: 1.19 (03/02 1009) Varicella:  Immune  GTT Early:  66             Third trimester:  RPR: Non Reactive (03/02 1009)   Rhogam  Positive HBsAg: Negative (03/02 1009)   TDaP vaccine                        Flu Shot:Declines HIV: Non Reactive (03/02 1009)   Baby Food   Bottle fed other child     GBS:   Contraception  None Pap:06/07/2016 NIL   CBB     CS/VBAC  Desires repeat C/S Scheduled 10/29/2018   Support Person                  Preterm labor symptoms and general obstetric precautions  reviewed.  Please refer to After Visit Summary for other counseling recommendations.   Return in about 2 weeks (around 09/16/2018) for [place] .  [name], CNM 09/02/2018  2:23 PM

## 2018-09-02 NOTE — Patient Instructions (Signed)
Third Trimester of Pregnancy The third trimester is from week 28 through week 40 (months 7 through 9). The third trimester is a time when the unborn baby (fetus) is growing rapidly. At the end of the ninth month, the fetus is about 20 inches in length and weighs 6-10 pounds. Body changes during your third trimester Your body will continue to go through many changes during pregnancy. The changes vary from woman to woman. During the third trimester:  Your weight will continue to increase. You can expect to gain 25-35 pounds (11-16 kg) by the end of the pregnancy.  You may begin to get stretch marks on your hips, abdomen, and breasts.  You may urinate more often because the fetus is moving lower into your pelvis and pressing on your bladder.  You may develop or continue to have heartburn. This is caused by increased hormones that slow down muscles in the digestive tract.  You may develop or continue to have constipation because increased hormones slow digestion and cause the muscles that push waste through your intestines to relax.  You may develop hemorrhoids. These are swollen veins (varicose veins) in the rectum that can itch or be painful.  You may develop swollen, bulging veins (varicose veins) in your legs.  You may have increased body aches in the pelvis, back, or thighs. This is due to weight gain and increased hormones that are relaxing your joints.  You may have changes in your hair. These can include thickening of your hair, rapid growth, and changes in texture. Some women also have hair loss during or after pregnancy, or hair that feels dry or thin. Your hair will most likely return to normal after your baby is born.  Your breasts will continue to grow and they will continue to become tender. A yellow fluid (colostrum) may leak from your breasts. This is the first milk you are producing for your baby.  Your belly button may stick out.  You may notice more swelling in your hands,  face, or ankles.  You may have increased tingling or numbness in your hands, arms, and legs. The skin on your belly may also feel numb.  You may feel short of breath because of your expanding uterus.  You may have more problems sleeping. This can be caused by the size of your belly, increased need to urinate, and an increase in your body's metabolism.  You may notice the fetus "dropping," or moving lower in your abdomen (lightening).  You may have increased vaginal discharge.  You may notice your joints feel loose and you may have pain around your pelvic bone. What to expect at prenatal visits You will have prenatal exams every 2 weeks until week 36. Then you will have weekly prenatal exams. During a routine prenatal visit:  You will be weighed to make sure you and the baby are growing normally.  Your blood pressure will be taken.  Your abdomen will be measured to track your baby's growth.  The fetal heartbeat will be listened to.  Any test results from the previous visit will be discussed.  You may have a cervical check near your due date to see if your cervix has softened or thinned (effaced).  You will be tested for Group B streptococcus. This happens between 35 and 37 weeks. Your health care provider may ask you:  What your birth plan is.  How you are feeling.  If you are feeling the baby move.  If you have had any abnormal   symptoms, such as leaking fluid, bleeding, severe headaches, or abdominal cramping.  If you are using any tobacco products, including cigarettes, chewing tobacco, and electronic cigarettes.  If you have any questions. Other tests or screenings that may be performed during your third trimester include:  Blood tests that check for low iron levels (anemia).  Fetal testing to check the health, activity level, and growth of the fetus. Testing is done if you have certain medical conditions or if there are problems during the pregnancy.  Nonstress test  (NST). This test checks the health of your baby to make sure there are no signs of problems, such as the baby not getting enough oxygen. During this test, a belt is placed around your belly. The baby is made to move, and its heart rate is monitored during movement. What is false labor? False labor is a condition in which you feel small, irregular tightenings of the muscles in the womb (contractions) that usually go away with rest, changing position, or drinking water. These are called Braxton Hicks contractions. Contractions may last for hours, days, or even weeks before true labor sets in. If contractions come at regular intervals, become more frequent, increase in intensity, or become painful, you should see your health care provider. What are the signs of labor?  Abdominal cramps.  Regular contractions that start at 10 minutes apart and become stronger and more frequent with time.  Contractions that start on the top of the uterus and spread down to the lower abdomen and back.  Increased pelvic pressure and dull back pain.  A watery or bloody mucus discharge that comes from the vagina.  Leaking of amniotic fluid. This is also known as your "water breaking." It could be a slow trickle or a gush. Let your health care provider know if it has a color or strange odor. If you have any of these signs, call your health care provider right away, even if it is before your due date. Follow these instructions at home: Medicines  Follow your health care provider's instructions regarding medicine use. Specific medicines may be either safe or unsafe to take during pregnancy.  Take a prenatal vitamin that contains at least 600 micrograms (mcg) of folic acid.  If you develop constipation, try taking a stool softener if your health care provider approves. Eating and drinking   Eat a balanced diet that includes fresh fruits and vegetables, whole grains, good sources of protein such as meat, eggs, or tofu,  and low-fat dairy. Your health care provider will help you determine the amount of weight gain that is right for you.  Avoid raw meat and uncooked cheese. These carry germs that can cause birth defects in the baby.  If you have low calcium intake from food, talk to your health care provider about whether you should take a daily calcium supplement.  Eat four or five small meals rather than three large meals a day.  Limit foods that are high in fat and processed sugars, such as fried and sweet foods.  To prevent constipation: ? Drink enough fluid to keep your urine clear or pale yellow. ? Eat foods that are high in fiber, such as fresh fruits and vegetables, whole grains, and beans. Activity  Exercise only as directed by your health care provider. Most women can continue their usual exercise routine during pregnancy. Try to exercise for 30 minutes at least 5 days a week. Stop exercising if you experience uterine contractions.  Avoid heavy lifting.  Do   not exercise in extreme heat or humidity, or at high altitudes.  Wear low-heel, comfortable shoes.  Practice good posture.  You may continue to have sex unless your health care provider tells you otherwise. Relieving pain and discomfort  Take frequent breaks and rest with your legs elevated if you have leg cramps or low back pain.  Take warm sitz baths to soothe any pain or discomfort caused by hemorrhoids. Use hemorrhoid cream if your health care provider approves.  Wear a good support bra to prevent discomfort from breast tenderness.  If you develop varicose veins: ? Wear support pantyhose or compression stockings as told by your healthcare provider. ? Elevate your feet for 15 minutes, 3-4 times a day. Prenatal care  Write down your questions. Take them to your prenatal visits.  Keep all your prenatal visits as told by your health care provider. This is important. Safety  Wear your seat belt at all times when driving.  Make  a list of emergency phone numbers, including numbers for family, friends, the hospital, and police and fire departments. General instructions  Avoid cat litter boxes and soil used by cats. These carry germs that can cause birth defects in the baby. If you have a cat, ask someone to clean the litter box for you.  Do not travel far distances unless it is absolutely necessary and only with the approval of your health care provider.  Do not use hot tubs, steam rooms, or saunas.  Do not drink alcohol.  Do not use any products that contain nicotine or tobacco, such as cigarettes and e-cigarettes. If you need help quitting, ask your health care provider.  Do not use any medicinal herbs or unprescribed drugs. These chemicals affect the formation and growth of the baby.  Do not douche or use tampons or scented sanitary pads.  Do not cross your legs for long periods of time.  To prepare for the arrival of your baby: ? Take prenatal classes to understand, practice, and ask questions about labor and delivery. ? Make a trial run to the hospital. ? Visit the hospital and tour the maternity area. ? Arrange for maternity or paternity leave through employers. ? Arrange for family and friends to take care of pets while you are in the hospital. ? Purchase a rear-facing car seat and make sure you know how to install it in your car. ? Pack your hospital bag. ? Prepare the baby's nursery. Make sure to remove all pillows and stuffed animals from the baby's crib to prevent suffocation.  Visit your dentist if you have not gone during your pregnancy. Use a soft toothbrush to brush your teeth and be gentle when you floss. Contact a health care provider if:  You are unsure if you are in labor or if your water has broken.  You become dizzy.  You have mild pelvic cramps, pelvic pressure, or nagging pain in your abdominal area.  You have lower back pain.  You have persistent nausea, vomiting, or diarrhea.   You have an unusual or bad smelling vaginal discharge.  You have pain when you urinate. Get help right away if:  Your water breaks before 37 weeks.  You have regular contractions less than 5 minutes apart before 37 weeks.  You have a fever.  You are leaking fluid from your vagina.  You have spotting or bleeding from your vagina.  You have severe abdominal pain or cramping.  You have rapid weight loss or weight gain.  You have   shortness of breath with chest pain.  You notice sudden or extreme swelling of your face, hands, ankles, feet, or legs.  Your baby makes fewer than 10 movements in 2 hours.  You have severe headaches that do not go away when you take medicine.  You have vision changes. Summary  The third trimester is from week 28 through week 40, months 7 through 9. The third trimester is a time when the unborn baby (fetus) is growing rapidly.  During the third trimester, your discomfort may increase as you and your baby continue to gain weight. You may have abdominal, leg, and back pain, sleeping problems, and an increased need to urinate.  During the third trimester your breasts will keep growing and they will continue to become tender. A yellow fluid (colostrum) may leak from your breasts. This is the first milk you are producing for your baby.  False labor is a condition in which you feel small, irregular tightenings of the muscles in the womb (contractions) that eventually go away. These are called Braxton Hicks contractions. Contractions may last for hours, days, or even weeks before true labor sets in.  Signs of labor can include: abdominal cramps; regular contractions that start at 10 minutes apart and become stronger and more frequent with time; watery or bloody mucus discharge that comes from the vagina; increased pelvic pressure and dull back pain; and leaking of amniotic fluid. This information is not intended to replace advice given to you by your health  care provider. Make sure you discuss any questions you have with your health care provider. Document Released: 01/17/2001 Document Revised: 05/16/2018 Document Reviewed: 02/29/2016 Elsevier Patient Education  2020 [employer]  

## 2018-09-16 ENCOUNTER — Other Ambulatory Visit: Payer: Self-pay

## 2018-09-16 ENCOUNTER — Ambulatory Visit (INDEPENDENT_AMBULATORY_CARE_PROVIDER_SITE_OTHER): Payer: Medicaid Other | Admitting: Obstetrics & Gynecology

## 2018-09-16 VITALS — BP 120/80 | Wt 266.0 lb

## 2018-09-16 DIAGNOSIS — O34219 Maternal care for unspecified type scar from previous cesarean delivery: Secondary | ICD-10-CM

## 2018-09-16 DIAGNOSIS — Z3A33 33 weeks gestation of pregnancy: Secondary | ICD-10-CM

## 2018-09-16 DIAGNOSIS — Z3480 Encounter for supervision of other normal pregnancy, unspecified trimester: Secondary | ICD-10-CM

## 2018-09-16 NOTE — Progress Notes (Signed)
  Subjective  Fetal Movement? yes Contractions? no Leaking Fluid? no Vaginal Bleeding? no  Objective  BP 120/80   Wt 266 lb (120.7 kg)   LMP 01/25/2018 Comment: depoprovera  BMI 44.26 kg/m  General: NAD Pumonary: no increased work of breathing Abdomen: gravid, non-tender Extremities: no edema Psychiatric: mood appropriate, affect full  Assessment  28 y.o. G2P1001 at [redacted]w[redacted]d by  11/01/2018, by Last Menstrual Period presenting for routine prenatal visit  Plan   Problem List Items Addressed This Visit      Other   Previous cesarean delivery affecting pregnancy, antepartum   Supervision of other normal pregnancy, antepartum - Primary    Other Visit Diagnoses    [redacted] weeks gestation of pregnancy          pregnancy2 Problems (from 01/25/18 to present)    Problem Noted Resolved   Supervision of other normal pregnancy, antepartum 03/29/2018 by Rexene Agent, CNM No   Overview Addendum 09/02/2018  2:14 PM by Rexene Agent, [place] Prenatal Labs  Dating LMP = [redacted]w[redacted]d [place] Blood type: B/Positive/-- (03/02 1009)   Genetic Screen Declined 04/08/2018 Antibody:Negative (03/02 1009)  Anatomic [place] complete Rubella: 1.19 (03/02 1009) Varicella:  Immune  GTT Early:  81             Third trimester: 118 RPR: Non Reactive (03/02 1009)   Rhogam  Positive HBsAg: Negative (03/02 1009)   TDaP vaccine 08/19/2018                       Flu Shot:Declines HIV: Non Reactive (03/02 1009)   Baby Food   Bottle fed other child     GBS:p   Contraception  None Pap:06/07/2016 NIL   CBB  no   CS/VBAC  Desires repeat C/S Scheduled 10/29/2018   Support Person                PNV, [place], PTL precautions  Barnett Applebaum, MD, [facility], [facility] 09/16/2018  2:33 PM

## 2018-09-16 NOTE — Patient Instructions (Signed)
Third Trimester of Pregnancy The third trimester is from week 28 through week 40 (months 7 through 9). The third trimester is a time when the unborn baby (fetus) is growing rapidly. At the end of the ninth month, the fetus is about 20 inches in length and weighs 6-10 pounds. Body changes during your third trimester Your body will continue to go through many changes during pregnancy. The changes vary from woman to woman. During the third trimester:  Your weight will continue to increase. You can expect to gain 25-35 pounds (11-16 kg) by the end of the pregnancy.  You may begin to get stretch marks on your hips, abdomen, and breasts.  You may urinate more often because the fetus is moving lower into your pelvis and pressing on your bladder.  You may develop or continue to have heartburn. This is caused by increased hormones that slow down muscles in the digestive tract.  You may develop or continue to have constipation because increased hormones slow digestion and cause the muscles that push waste through your intestines to relax.  You may develop hemorrhoids. These are swollen veins (varicose veins) in the rectum that can itch or be painful.  You may develop swollen, bulging veins (varicose veins) in your legs.  You may have increased body aches in the pelvis, back, or thighs. This is due to weight gain and increased hormones that are relaxing your joints.  You may have changes in your hair. These can include thickening of your hair, rapid growth, and changes in texture. Some women also have hair loss during or after pregnancy, or hair that feels dry or thin. Your hair will most likely return to normal after your baby is born.  Your breasts will continue to grow and they will continue to become tender. A yellow fluid (colostrum) may leak from your breasts. This is the first milk you are producing for your baby.  Your belly button may stick out.  You may notice more swelling in your hands,  face, or ankles.  You may have increased tingling or numbness in your hands, arms, and legs. The skin on your belly may also feel numb.  You may feel short of breath because of your expanding uterus.  You may have more problems sleeping. This can be caused by the size of your belly, increased need to urinate, and an increase in your body's metabolism.  You may notice the fetus "dropping," or moving lower in your abdomen (lightening).  You may have increased vaginal discharge.  You may notice your joints feel loose and you may have pain around your pelvic bone. What to expect at prenatal visits You will have prenatal exams every 2 weeks until week 36. Then you will have weekly prenatal exams. During a routine prenatal visit:  You will be weighed to make sure you and the baby are growing normally.  Your blood pressure will be taken.  Your abdomen will be measured to track your baby's growth.  The fetal heartbeat will be listened to.  Any test results from the previous visit will be discussed.  You may have a cervical check near your due date to see if your cervix has softened or thinned (effaced).  You will be tested for Group B streptococcus. This happens between 35 and 37 weeks. Your health care provider may ask you:  What your birth plan is.  How you are feeling.  If you are feeling the baby move.  If you have had any abnormal   symptoms, such as leaking fluid, bleeding, severe headaches, or abdominal cramping.  If you are using any tobacco products, including cigarettes, chewing tobacco, and electronic cigarettes.  If you have any questions. Other tests or screenings that may be performed during your third trimester include:  Blood tests that check for low iron levels (anemia).  Fetal testing to check the health, activity level, and growth of the fetus. Testing is done if you have certain medical conditions or if there are problems during the pregnancy.  Nonstress test  (NST). This test checks the health of your baby to make sure there are no signs of problems, such as the baby not getting enough oxygen. During this test, a belt is placed around your belly. The baby is made to move, and its heart rate is monitored during movement. What is false labor? False labor is a condition in which you feel small, irregular tightenings of the muscles in the womb (contractions) that usually go away with rest, changing position, or drinking water. These are called Braxton Hicks contractions. Contractions may last for hours, days, or even weeks before true labor sets in. If contractions come at regular intervals, become more frequent, increase in intensity, or become painful, you should see your health care provider. What are the signs of labor?  Abdominal cramps.  Regular contractions that start at 10 minutes apart and become stronger and more frequent with time.  Contractions that start on the top of the uterus and spread down to the lower abdomen and back.  Increased pelvic pressure and dull back pain.  A watery or bloody mucus discharge that comes from the vagina.  Leaking of amniotic fluid. This is also known as your "water breaking." It could be a slow trickle or a gush. Let your health care provider know if it has a color or strange odor. If you have any of these signs, call your health care provider right away, even if it is before your due date. Follow these instructions at home: Medicines  Follow your health care provider's instructions regarding medicine use. Specific medicines may be either safe or unsafe to take during pregnancy.  Take a prenatal vitamin that contains at least 600 micrograms (mcg) of folic acid.  If you develop constipation, try taking a stool softener if your health care provider approves. Eating and drinking   Eat a balanced diet that includes fresh fruits and vegetables, whole grains, good sources of protein such as meat, eggs, or tofu,  and low-fat dairy. Your health care provider will help you determine the amount of weight gain that is right for you.  Avoid raw meat and uncooked cheese. These carry germs that can cause birth defects in the baby.  If you have low calcium intake from food, talk to your health care provider about whether you should take a daily calcium supplement.  Eat four or five small meals rather than three large meals a day.  Limit foods that are high in fat and processed sugars, such as fried and sweet foods.  To prevent constipation: ? Drink enough fluid to keep your urine clear or pale yellow. ? Eat foods that are high in fiber, such as fresh fruits and vegetables, whole grains, and beans. Activity  Exercise only as directed by your health care provider. Most women can continue their usual exercise routine during pregnancy. Try to exercise for 30 minutes at least 5 days a week. Stop exercising if you experience uterine contractions.  Avoid heavy lifting.  Do   not exercise in extreme heat or humidity, or at high altitudes.  Wear low-heel, comfortable shoes.  Practice good posture.  You may continue to have sex unless your health care provider tells you otherwise. Relieving pain and discomfort  Take frequent breaks and rest with your legs elevated if you have leg cramps or low back pain.  Take warm sitz baths to soothe any pain or discomfort caused by hemorrhoids. Use hemorrhoid cream if your health care provider approves.  Wear a good support bra to prevent discomfort from breast tenderness.  If you develop varicose veins: ? Wear support pantyhose or compression stockings as told by your healthcare provider. ? Elevate your feet for 15 minutes, 3-4 times a day. Prenatal care  Write down your questions. Take them to your prenatal visits.  Keep all your prenatal visits as told by your health care provider. This is important. Safety  Wear your seat belt at all times when driving.  Make  a list of emergency phone numbers, including numbers for family, friends, the hospital, and police and fire departments. General instructions  Avoid cat litter boxes and soil used by cats. These carry germs that can cause birth defects in the baby. If you have a cat, ask someone to clean the litter box for you.  Do not travel far distances unless it is absolutely necessary and only with the approval of your health care provider.  Do not use hot tubs, steam rooms, or saunas.  Do not drink alcohol.  Do not use any products that contain nicotine or tobacco, such as cigarettes and e-cigarettes. If you need help quitting, ask your health care provider.  Do not use any medicinal herbs or unprescribed drugs. These chemicals affect the formation and growth of the baby.  Do not douche or use tampons or scented sanitary pads.  Do not cross your legs for long periods of time.  To prepare for the arrival of your baby: ? Take prenatal classes to understand, practice, and ask questions about labor and delivery. ? Make a trial run to the hospital. ? Visit the hospital and tour the maternity area. ? Arrange for maternity or paternity leave through employers. ? Arrange for family and friends to take care of pets while you are in the hospital. ? Purchase a rear-facing car seat and make sure you know how to install it in your car. ? Pack your hospital bag. ? Prepare the baby's nursery. Make sure to remove all pillows and stuffed animals from the baby's crib to prevent suffocation.  Visit your dentist if you have not gone during your pregnancy. Use a soft toothbrush to brush your teeth and be gentle when you floss. Contact a health care provider if:  You are unsure if you are in labor or if your water has broken.  You become dizzy.  You have mild pelvic cramps, pelvic pressure, or nagging pain in your abdominal area.  You have lower back pain.  You have persistent nausea, vomiting, or diarrhea.   You have an unusual or bad smelling vaginal discharge.  You have pain when you urinate. Get help right away if:  Your water breaks before 37 weeks.  You have regular contractions less than 5 minutes apart before 37 weeks.  You have a fever.  You are leaking fluid from your vagina.  You have spotting or bleeding from your vagina.  You have severe abdominal pain or cramping.  You have rapid weight loss or weight gain.  You have   shortness of breath with chest pain.  You notice sudden or extreme swelling of your face, hands, ankles, feet, or legs.  Your baby makes fewer than 10 movements in 2 hours.  You have severe headaches that do not go away when you take medicine.  You have vision changes. Summary  The third trimester is from week 28 through week 40, months 7 through 9. The third trimester is a time when the unborn baby (fetus) is growing rapidly.  During the third trimester, your discomfort may increase as you and your baby continue to gain weight. You may have abdominal, leg, and back pain, sleeping problems, and an increased need to urinate.  During the third trimester your breasts will keep growing and they will continue to become tender. A yellow fluid (colostrum) may leak from your breasts. This is the first milk you are producing for your baby.  False labor is a condition in which you feel small, irregular tightenings of the muscles in the womb (contractions) that eventually go away. These are called Braxton Hicks contractions. Contractions may last for hours, days, or even weeks before true labor sets in.  Signs of labor can include: abdominal cramps; regular contractions that start at 10 minutes apart and become stronger and more frequent with time; watery or bloody mucus discharge that comes from the vagina; increased pelvic pressure and dull back pain; and leaking of amniotic fluid. This information is not intended to replace advice given to you by your health  care provider. Make sure you discuss any questions you have with your health care provider. Document Released: 01/17/2001 Document Revised: 05/16/2018 Document Reviewed: 02/29/2016 Elsevier Patient Education  2020 [employer]  

## 2018-09-24 ENCOUNTER — Observation Stay
Admission: EM | Admit: 2018-09-24 | Discharge: 2018-09-24 | Disposition: A | Discharge status: Home / Self Care | Payer: Medicaid Other | Attending: Obstetrics and Gynecology | Admitting: Obstetrics and Gynecology

## 2018-09-24 ENCOUNTER — Encounter: Payer: Self-pay | Admitting: *Deleted

## 2018-09-24 DIAGNOSIS — O368130 Decreased fetal movements, third trimester, not applicable or unspecified: Secondary | ICD-10-CM

## 2018-09-24 DIAGNOSIS — Z3A34 34 weeks gestation of pregnancy: Secondary | ICD-10-CM | POA: Insufficient documentation

## 2018-09-24 DIAGNOSIS — Z3480 Encounter for supervision of other normal pregnancy, unspecified trimester: Secondary | ICD-10-CM

## 2018-09-24 DIAGNOSIS — O368190 Decreased fetal movements, unspecified trimester, not applicable or unspecified: Secondary | ICD-10-CM | POA: Diagnosis present

## 2018-09-24 NOTE — OB Triage Note (Signed)
Recvd pt from ED. Pt states she had not felt baby move since late last night. I applied EFM and mom felt baby move. Pt also complains of vaginal pressure. No LOF or vaginal bleeding. Pt has not had much to drink today. Pt denies feeling contractions.

## 2018-09-24 NOTE — Discharge Summary (Signed)
Physician Final Progress Note  Patient ID: Erika Wiley MRN: [id number] DOB/AGE: Sep 10, 1990 28 y.o.  Admit date: 09/24/2018 Admitting provider: Vena AustriaAndreas Taylor Levick, MD Discharge date: 09/24/2018   Admission Diagnoses: Decreased fetal movement  Discharge Diagnoses:  Active Problems:   Decreased fetal movement   28 y.o. G2P1001 at 5654w4d presenting with decreased fetal movement since yesterday evening.  No contractions, no LOF, no VB.  On arrival and being placed to NST patient reports feeling fetal movement.  Also reported being able to feel movement in response to vibroacoustic stimulation.  Patient reassured, we discussed that while not uncommon in future to present as early as possible if movement concerns.  BP 129/76 (BP Location: Left Arm)   Pulse (!) 110   Temp 98.1 F (36.7 C)   Resp 16   LMP 01/25/2018 Comment: depoprovera  pregnancy2 Problems (from 01/25/18 to present)    Problem Noted Resolved   Supervision of other normal pregnancy, antepartum 03/29/2018 by Oswaldo ConroySchmid, Jacelyn Y, CNM No   Overview Addendum 09/02/2018  2:14 PM by Oswaldo ConroySchmid, Jacelyn Y, CNM    Clinic [facility] Prenatal Labs  Dating LMP = 1429w1d US Blood type: B/Positive/-- (03/02 1009)   Genetic Screen Declined 04/08/2018 Antibody:Negative (03/02 1009)  Anatomic US complete Rubella: 1.19 (03/02 1009) Varicella:  Immune  GTT Early:  118             Third trimester: 118 RPR: Non Reactive (03/02 1009)   Rhogam  Positive HBsAg: Negative (03/02 1009)   TDaP vaccine 08/19/2018                       Flu Shot:Declines HIV: Non Reactive (03/02 1009)   Baby Food   Bottle fed other child     GBS:   Contraception  None Pap:06/07/2016 NIL   CBB     CS/VBAC  Desires repeat C/S Scheduled 10/29/2018   Support Person                 Consults: None  Significant Findings/ Diagnostic Studies: none  Procedures:  Baseline: 145 Variability: moderate Accelerations: present Decelerations: absent Tocometry: none The patient  was monitored for 30 minutes, fetal heart rate tracing was deemed reactive, category I tracing,  CPT [id number]   Discharge Condition: good  Disposition: Discharge disposition: 01-Home or Self Care       Diet: Regular diet  Discharge Activity: Activity as tolerated  Discharge Instructions    Discharge activity:  No Restrictions   Complete by: As directed    Discharge diet:  No restrictions   Complete by: As directed    Fetal Kick Count:  Lie on our left side for one hour after a meal, and count the number of times your baby kicks.  If it is less than 5 times, get up, move around and drink some juice.  Repeat the test 30 minutes later.  If it is still less than 5 kicks in an hour, notify your doctor.   Complete by: As directed    LABOR:  When conractions begin, you should start to time them from the beginning of one contraction to the beginning  of the next.  When contractions are 5 - 10 minutes apart or less and have been regular for at least an hour, you should call your health care provider.   Complete by: As directed    No sexual activity restrictions   Complete by: As directed    Notify physician for bleeding from the  vagina   Complete by: As directed    Notify physician for blurring of vision or spots before the eyes   Complete by: As directed    Notify physician for chills or fever   Complete by: As directed    Notify physician for fainting spells, "black outs" or loss of consciousness   Complete by: As directed    Notify physician for increase in vaginal discharge   Complete by: As directed    Notify physician for leaking of fluid   Complete by: As directed    Notify physician for pain or burning when urinating   Complete by: As directed    Notify physician for pelvic pressure (sudden increase)   Complete by: As directed    Notify physician for severe or continued nausea or vomiting   Complete by: As directed    Notify physician for sudden gushing of fluid from the  vagina (with or without continued leaking)   Complete by: As directed    Notify physician for sudden, constant, or occasional abdominal pain   Complete by: As directed    Notify physician if baby moving less than usual   Complete by: As directed      Allergies as of 09/24/2018   No Known Allergies     Medication List    TAKE these medications   Doxylamine-Pyridoxine ER 20-20 MG Tbcr Commonly known as: Bonjesta Take 1 tablet by mouth at bedtime.   multivitamin-prenatal 27-0.8 MG Tabs tablet Take 1 tablet by mouth daily at 12 noon.        Total time spent taking care of this patient: 30 minutes  Signed: Malachy Mood 09/24/2018, 9:11 PM

## 2018-09-30 ENCOUNTER — Encounter: Payer: Self-pay | Admitting: Obstetrics & Gynecology

## 2018-09-30 ENCOUNTER — Ambulatory Visit (INDEPENDENT_AMBULATORY_CARE_PROVIDER_SITE_OTHER): Payer: Medicaid Other | Admitting: Obstetrics & Gynecology

## 2018-09-30 ENCOUNTER — Other Ambulatory Visit: Payer: Self-pay

## 2018-09-30 VITALS — BP 128/80 | Wt 266.0 lb

## 2018-09-30 DIAGNOSIS — Z3480 Encounter for supervision of other normal pregnancy, unspecified trimester: Secondary | ICD-10-CM

## 2018-09-30 DIAGNOSIS — O34219 Maternal care for unspecified type scar from previous cesarean delivery: Secondary | ICD-10-CM

## 2018-09-30 DIAGNOSIS — Z3A35 35 weeks gestation of pregnancy: Secondary | ICD-10-CM

## 2018-09-30 NOTE — Patient Instructions (Signed)
Group B Streptococcus Infection During Pregnancy  Group B Streptococcus (GBS) is a type of bacteria (Streptococcus agalactiae) that is often found in healthy people, commonly in the rectum, vagina, and intestines. In people who are healthy and not pregnant, the bacteria rarely cause serious illness or complications. However, women who test positive for GBS during pregnancy can pass the bacteria to their baby during childbirth, which can cause serious infection in the baby after birth. Women with GBS may also have infections during their pregnancy or immediately after childbirth, such as urinary tract infections (UTIs) or infections of the uterus (uterine infections). Having GBS also increases a woman's risk of complications during pregnancy, such as early (preterm) labor or delivery, miscarriage, or stillbirth. Routine testing (screening) for GBS is recommended for all pregnant women. What increases the risk? You may have a higher risk for GBS infection during pregnancy if you had one during a past pregnancy. What are the signs or symptoms? In most cases, GBS infection does not cause symptoms in pregnant women. Signs and symptoms of a possible GBS-related infection may include:  Labor starting before the 37th week of pregnancy.  A UTI or bladder infection, which may cause: ? Fever. ? Pain or burning during urination. ? Frequent urination.  Fever during labor, along with: ? Bad-smelling discharge. ? Uterine tenderness. ? Rapid heartbeat in the mother, baby, or both. Rare but serious symptoms of a possible GBS-related infection in women include:  Blood infection (septicemia). This may cause fever, chills, or confusion.  Lung infection (pneumonia). This may cause fever, chills, cough, rapid breathing, difficulty breathing, or chest pain.  Bone, joint, skin, or soft tissue infection. How is this diagnosed? You may be screened for GBS between week 35 and week 37 of your pregnancy. If you have  symptoms of preterm labor, you may be screened earlier. This condition is diagnosed based on lab test results from:  A swab of fluid from the vagina and rectum.  A urine sample. How is this treated? This condition is treated with antibiotic medicine. When you go into labor, or as soon as your water breaks (your membranes rupture), you will be given antibiotics through an IV tube. Antibiotics will continue until after you give birth. If you are having a cesarean delivery, you do not need antibiotics unless your membranes have already ruptured. Follow these instructions at home:  Take over-the-counter and prescription medicines only as told by your health care provider.  Take your antibiotic medicine as told by your health care provider. Do not stop taking the antibiotic even if you start to feel better.  Keep all pre-birth (prenatal) visits and follow-up visits as told by your health care provider. This is important. Contact a health care provider if:  You have pain or burning when you urinate.  You have to urinate frequently.  You have a fever or chills.  You develop a bad-smelling vaginal discharge. Get help right away if:  Your membranes rupture.  You go into labor.  You have severe pain in your abdomen.  You have difficulty breathing.  You have chest pain. This information is not intended to replace advice given to you by your health care provider. Make sure you discuss any questions you have with your health care provider. Document Released: 05/02/2007 Document Revised: 05/16/2018 Document Reviewed: 08/19/2015 Elsevier Patient Education  2020 Elsevier Inc.  

## 2018-09-30 NOTE — Progress Notes (Signed)
  Subjective  Fetal Movement? yes Contractions? no Leaking Fluid? no Vaginal Bleeding? no  Objective  BP 128/80   Wt 266 lb (120.7 kg)   LMP 01/25/2018 Comment: depoprovera  BMI 44.26 kg/m  General: NAD Pumonary: no increased work of breathing Abdomen: gravid, non-tender Extremities: no edema Psychiatric: mood appropriate, affect full  Assessment  28 y.o. G2P1001 at [redacted]w[redacted]d by  11/01/2018, by Last Menstrual Period presenting for routine prenatal visit  Plan   Problem List Items Addressed This Visit      Other   Previous cesarean delivery affecting pregnancy, antepartum   Supervision of other normal pregnancy, antepartum    Other Visit Diagnoses    [redacted] weeks gestation of pregnancy    -  Primary    GBS nv CS 10/29/18  pregnancy2 Problems (from 01/25/18 to present)    Problem Noted Resolved   Supervision of other normal pregnancy, antepartum 03/29/2018 by Rexene Agent, CNM No   Overview Addendum 09/02/2018  2:14 PM by Rexene Agent, [place] Prenatal Labs  Dating LMP = [redacted]w[redacted]d [place] Blood type: B/Positive/-- (03/02 1009)   Genetic Screen Declined 04/08/2018 Antibody:Negative (03/02 1009)  Anatomic [place] complete Rubella: 1.19 (03/02 1009) Varicella:  Immune  GTT Early:  76             Third trimester: 118 RPR: Non Reactive (03/02 1009)   Rhogam  Positive HBsAg: Negative (03/02 1009)   TDaP vaccine 08/19/2018                       Flu Shot:Declines HIV: Non Reactive (03/02 1009)   Baby Food   Bottle fed other child     GBS:   Contraception  None Pap:06/07/2016 NIL   CBB     CS/VBAC  Desires repeat C/S Scheduled 10/29/2018   Support Person                  Barnett Applebaum, MD, [facility], [facility] 09/30/2018  2:12 PM

## 2018-10-08 ENCOUNTER — Ambulatory Visit (INDEPENDENT_AMBULATORY_CARE_PROVIDER_SITE_OTHER): Payer: Medicaid Other | Admitting: Obstetrics and Gynecology

## 2018-10-08 ENCOUNTER — Other Ambulatory Visit: Payer: Self-pay

## 2018-10-08 VITALS — BP 128/66 | Wt 270.0 lb

## 2018-10-08 DIAGNOSIS — Z3480 Encounter for supervision of other normal pregnancy, unspecified trimester: Secondary | ICD-10-CM

## 2018-10-08 DIAGNOSIS — Z3685 Encounter for antenatal screening for Streptococcus B: Secondary | ICD-10-CM

## 2018-10-08 DIAGNOSIS — Z3A36 36 weeks gestation of pregnancy: Secondary | ICD-10-CM

## 2018-10-08 DIAGNOSIS — O34219 Maternal care for unspecified type scar from previous cesarean delivery: Secondary | ICD-10-CM

## 2018-10-08 NOTE — Progress Notes (Signed)
ROB GBS today Declined Flu vaccine

## 2018-10-08 NOTE — Progress Notes (Signed)
    Routine Prenatal Care Visit  Subjective  Erika Wiley is a 28 y.o. G2P1001 at [redacted]w[redacted]d being seen today for ongoing prenatal care.  She is currently monitored for the following issues for this high-risk pregnancy and has Previous cesarean delivery affecting pregnancy, antepartum; Supervision of other normal pregnancy, antepartum; BMI 36.0-36.9,adult; and Decreased fetal movement on their problem list.  ----------------------------------------------------------------------------------- Patient reports no complaints.   Contractions: Not present. Vag. Bleeding: None.  Movement: Present. Denies leaking of fluid.  ----------------------------------------------------------------------------------- The following portions of the patient's history were reviewed and updated as appropriate: allergies, current medications, past family history, past medical history, past social history, past surgical history and problem list. Problem list updated.   Objective  Blood pressure 128/66, weight 270 lb (122.5 kg), last menstrual period 01/25/2018. Pregravid weight 222 lb (100.7 kg) Total Weight Gain 48 lb (21.8 kg)  Body mass index is 44.93 kg/m.  Urinalysis:      Fetal Status: Fetal Heart Rate (bpm): 130 Fundal Height: 38 cm Movement: Present     General:  Alert, oriented and cooperative. Patient is in no acute distress.  Skin: Skin is warm and dry. No rash noted.   Cardiovascular: Normal heart rate noted  Respiratory: Normal respiratory effort, no problems with respiration noted  Abdomen: Soft, gravid, appropriate for gestational age. Pain/Pressure: Absent     Pelvic:  Cervical exam deferred Dilation: Closed      Extremities: Normal range of motion.     ental Status: Normal mood and affect. Normal behavior. Normal judgment and thought content.     Assessment   28 y.o. G2P1001 at [redacted]w[redacted]d by  11/01/2018, by Last Menstrual Period presenting for routine prenatal visit  Plan   pregnancy2 Problems  (from 01/25/18 to present)    Problem Noted Resolved   Supervision of other normal pregnancy, antepartum 03/29/2018 by Rexene Agent, CNM No   Overview Addendum 09/02/2018  2:14 PM by Rexene Agent, [place] Prenatal Labs  Dating LMP = [redacted]w[redacted]d [place] Blood type: B/Positive/-- (03/02 1009)   Genetic Screen Declined 04/08/2018 Antibody:Negative (03/02 1009)  Anatomic [place] complete Rubella: 1.19 (03/02 1009) Varicella:  Immune  GTT Early:  37             Third trimester: 118 RPR: Non Reactive (03/02 1009)   Rhogam  Positive HBsAg: Negative (03/02 1009)   TDaP vaccine 08/19/2018                       Flu Shot:Declines HIV: Non Reactive (03/02 1009)   Baby Food   Bottle fed other child     GBS:   Contraception  None Pap:06/07/2016 NIL   CBB     CS/VBAC  Desires repeat C/S Scheduled 10/29/2018   Support Person                  Gestational age appropriate obstetric precautions including but not limited to vaginal bleeding, contractions, leaking of fluid and fetal movement were reviewed in detail with the patient.    GBS today  Return in about 1 week (around 10/15/2018) for ROB, growth scan, NST.  Malachy Mood, MD, [facility], [facility] 10/08/2018, 10:07 PM

## 2018-10-10 LAB — STREP GP B NAA: Strep Gp B NAA: NEGATIVE

## 2018-10-17 ENCOUNTER — Ambulatory Visit (INDEPENDENT_AMBULATORY_CARE_PROVIDER_SITE_OTHER): Payer: Medicaid Other | Admitting: Certified Nurse Midwife

## 2018-10-17 ENCOUNTER — Other Ambulatory Visit: Payer: Self-pay | Admitting: Obstetrics and Gynecology

## 2018-10-17 ENCOUNTER — Other Ambulatory Visit: Payer: Self-pay

## 2018-10-17 ENCOUNTER — Ambulatory Visit (INDEPENDENT_AMBULATORY_CARE_PROVIDER_SITE_OTHER): Payer: Medicaid Other

## 2018-10-17 VITALS — BP 120/80 | Wt 272.0 lb

## 2018-10-17 DIAGNOSIS — Z3A37 37 weeks gestation of pregnancy: Secondary | ICD-10-CM

## 2018-10-17 DIAGNOSIS — Z6841 Body Mass Index (BMI) 40.0 and over, adult: Secondary | ICD-10-CM

## 2018-10-17 DIAGNOSIS — O99213 Obesity complicating pregnancy, third trimester: Secondary | ICD-10-CM

## 2018-10-17 DIAGNOSIS — Z362 Encounter for other antenatal screening follow-up: Secondary | ICD-10-CM | POA: Diagnosis not present

## 2018-10-17 DIAGNOSIS — Z3480 Encounter for supervision of other normal pregnancy, unspecified trimester: Secondary | ICD-10-CM

## 2018-10-17 DIAGNOSIS — O34219 Maternal care for unspecified type scar from previous cesarean delivery: Secondary | ICD-10-CM

## 2018-10-17 DIAGNOSIS — E66813 Obesity, class 3: Secondary | ICD-10-CM | POA: Insufficient documentation

## 2018-10-17 DIAGNOSIS — O4703 False labor before 37 completed weeks of gestation, third trimester: Secondary | ICD-10-CM | POA: Diagnosis not present

## 2018-10-17 DIAGNOSIS — Z3689 Encounter for other specified antenatal screening: Secondary | ICD-10-CM

## 2018-10-17 LAB — POCT URINALYSIS DIPSTICK OB: Glucose, UA: NEGATIVE

## 2018-10-17 NOTE — Progress Notes (Signed)
No problems.rj 

## 2018-10-17 NOTE — Progress Notes (Signed)
HROB/ NST/ AFI for BMI> 40 kg/m2. Doing well. Having some mild BH contractions. Baby active. No vaginal bleeding Has repeat CS scheduled for 9/22. GBS negative NST reactive with baseline 135 and accelerations to 150s, moderate variability AFI 48.1 cm / cephalic/ EFW 85UD% (1#49FW) Labor precautions Preop appointment scheduled for 9/15 with Dr Georgianne Fick.  Dalia Heading, CNM

## 2018-10-22 ENCOUNTER — Other Ambulatory Visit: Payer: Self-pay

## 2018-10-22 ENCOUNTER — Encounter: Payer: Self-pay | Admitting: Obstetrics and Gynecology

## 2018-10-22 ENCOUNTER — Ambulatory Visit (INDEPENDENT_AMBULATORY_CARE_PROVIDER_SITE_OTHER): Payer: Medicaid Other | Admitting: Obstetrics and Gynecology

## 2018-10-22 VITALS — BP 132/76 | HR 92 | Ht 65.0 in | Wt 275.0 lb

## 2018-10-22 DIAGNOSIS — O34219 Maternal care for unspecified type scar from previous cesarean delivery: Secondary | ICD-10-CM

## 2018-10-22 DIAGNOSIS — Z3A38 38 weeks gestation of pregnancy: Secondary | ICD-10-CM

## 2018-10-22 DIAGNOSIS — Z3480 Encounter for supervision of other normal pregnancy, unspecified trimester: Secondary | ICD-10-CM

## 2018-10-22 DIAGNOSIS — O99213 Obesity complicating pregnancy, third trimester: Secondary | ICD-10-CM

## 2018-10-22 NOTE — Progress Notes (Signed)
Obstetric H&P   Chief Complaint: ROB schedule C-section  Prenatal Care Provider: [facility]  History of Present Illness: 28 y.o. G2P1001 [redacted]w[redacted]d by 11/01/2018, by Last Menstrual Period presenting for ROB and C-section consent.  +FM, no LOF, no VB, no VB.     Pregravid weight 222 lb (100.7 kg) Total Weight Gain 50 lb (22.7 kg)  pregnancy2 Problems (from 01/25/18 to present)    Problem Noted Resolved   Supervision of other normal pregnancy, antepartum 03/29/2018 by Rexene Agent, CNM No   Overview Addendum 09/02/2018  2:14 PM by Rexene Agent, [place] Prenatal Labs  Dating LMP = [redacted]w[redacted]d [place] Blood type: B/Positive/-- (03/02 1009)   Genetic Screen Declined 04/08/2018 Antibody:Negative (03/02 1009)  Anatomic [place] complete Rubella: 1.19 (03/02 1009) Varicella:  Immune  GTT Early:  59             Third trimester: 118 RPR: Non Reactive (03/02 1009)   Rhogam  Positive HBsAg: Negative (03/02 1009)   TDaP vaccine 08/19/2018                       Flu Shot:Declines HIV: Non Reactive (03/02 1009)   Baby Food   Bottle fed other child     GBS: Negative  Contraception  None Pap:06/07/2016 NIL   CBB     CS/VBAC  Desires repeat C/S Scheduled 10/29/2018   Support Person                  Review of Systems: 10 point review of systems negative unless otherwise noted in HPI  Past Medical History: No past medical history on file.  Past Surgical History: Past Surgical History:  Procedure Laterality Date  . CESAREAN SECTION      Past Obstetric History: # 1 - Date: 06/16/11, Sex: Female, Weight: 5 lb 2.1 oz (2.327 kg), GA: [redacted]w[redacted]d, Delivery: C-Section, Unspecified, Apgar1: None, Apgar5: None, Living: None, Birth Comments: None  # 2 - Date: None, Sex: None, Weight: None, GA: None, Delivery: None, Apgar1: None, Apgar5: None, Living: None, Birth Comments: None   Past Gynecologic History:  Family History: Family History  Problem Relation Age of Onset  . Hyperlipidemia Mother   .  Diabetes Father     Social History: Social History   Socioeconomic History  . Marital status: Single    Spouse name: Not on file  . Number of children: Not on file  . Years of education: Not on file  . Highest education level: Not on file  Occupational History  . Not on file  Social Needs  . Financial resource strain: Not on file  . Food insecurity    Worry: Not on file    Inability: Not on file  . Transportation needs    Medical: Not on file    Non-medical: Not on file  Tobacco Use  . Smoking status: Never Smoker  . Smokeless tobacco: Never Used  Substance and Sexual Activity  . Alcohol use: Not Currently    Comment: social  . Drug use: Never  . Sexual activity: Yes    Birth control/protection: None  Lifestyle  . Physical activity    Days per week: Not on file    Minutes per session: Not on file  . Stress: Not on file  Relationships  . Social Herbalist on phone: Not on file    Gets together: Not on file    Attends religious service: Not  on file    Active member of club or organization: Not on file    Attends meetings of clubs or organizations: Not on file    Relationship status: Not on file  . Intimate partner violence    Fear of current or ex partner: Not on file    Emotionally abused: Not on file    Physically abused: Not on file    Forced sexual activity: Not on file  Other Topics Concern  . Not on file  Social History Narrative  . Not on file    Medications: Prior to Admission medications   Not on File    Allergies: No Known Allergies  Physical Exam: Vitals:Blood pressure 132/76, pulse 92, height 5\' 5"  (1.651 m), weight 275 lb (124.7 kg), last menstrual period 01/25/2018.  Last menstrual period 01/25/2018.  FHT: 135  General: NAD HEENT: normocephalic, anicteric Pulmonary: No increased work of breathing Cardiovascular: RRR, distal pulses 2+ Abdomen: Gravid, non-tender Leopolds: vtx Genitourinary: deferred Extremities: no  edema, erythema, or tenderness Neurologic: Grossly intact Psychiatric: mood appropriate, affect full  Labs: No results found for this or any previous visit (from the past 24 hour(s)).  [place] [facility] Follow Up  Result Date: 10/17/2018 Patient Name: Alcus Dadeaira A Losee DOB: 1990/06/19 MRN: [id number] ULTRASOUND REPORT Location: [facility] Date of Service: 10/17/2018 Indications:growth/afi Findings: Mason JimSingleton intrauterine pregnancy is visualized with FHR at 138 BPM. Biometrics give an (U/S) Gestational age 1824w0d and an (U/S) EDD of 11/07/2018; this correlates with the clinically established Estimated Date of Delivery: 11/01/2018.  Fetal presentation is Cephalic. Placenta: posterior. Grade: 3 AFI: 12.1 cm Growth percentile is 36%. EFW: 3,038 g (6 lb 11 oz) Impression: 1. 3472w6d Viable Singleton Intrauterine pregnancy previously established criteria. 2. Growth is 36 %ile.  AFI is 12.1 cm. Deanna ArtisElyse S Fairbanks, RT The ultrasound images and findings were reviewed by me and I agree with the above report. Thomasene MohairStephen Jackson, MD, Merlinda FrederickFACOG [facility], [facility] 10/17/2018 3:04 PM       Assessment: 28 y.o. G2P1001 562w4d by 11/01/2018, by Last Menstrual Period history of prior cesarean section  Plan: 1) The patient was counseled regarding risk and benefits to proceeding with Cesarean section to expedite delivery.  Risk of cesarean section were discussed including risk of bleeding and need for potential intraoperative or postoperative blood transfusion with a rate of approximately 5% quoted for all Cesarean sections, risk of injury to adjacent organs including but not limited to bowl and bladder, the need for additional surgical procedures to address such injuries, and the risk of infection.  The risk of continued attempts at vaginal delivery include but are note limited to worsening fetal or maternal status.  After consideration of options the patient is amenable to proceed with primary cesarean section for  delivery.  2) Fetus - +FHT  3) PNL - Blood type B/Positive/-- (03/02 1009) / Anti-bodyscreen Negative (03/02 1009) / Rubella 1.19 (03/02 1009) / Varicella Immune / RPR Non Reactive (06/29 1125) / HBsAg Negative (03/02 1009) / HIV Non Reactive (06/29 1125) / 1-hr OGTT 118 / GBS --Theda Sers/Negative (09/01 1240)  4) Immunization History -  Immunization History  Administered Date(s) Administered  . Tdap 08/19/2018    5) Disposition - pending delivery  Vena AustriaAndreas Ernesta Trabert, MD, Merlinda FrederickFACOG [facility], [facility] 10/22/2018, 11:14 AM

## 2018-10-23 ENCOUNTER — Encounter
Admission: RE | Admit: 2018-10-23 | Discharge: 2018-10-23 | Disposition: A | Discharge status: Home / Self Care | Payer: Medicaid Other | Source: Ambulatory Visit | Attending: Obstetrics and Gynecology | Admitting: Obstetrics and Gynecology

## 2018-10-23 NOTE — Patient Instructions (Signed)
Your procedure is scheduled on: Tuesday 10/29/18  Call [phone] on Monday 10/27/20 to verify your arrival time for Tuesday morning.    To find out your arrival time please call [phone] between 1PM - 3PM on  Remember: Instructions that are not followed completely may result in serious medical risk, up to and including death, or upon the discretion of your surgeon and anesthesiologist your surgery may need to be rescheduled.      _X__ 1. Do not eat food after midnight the night before your procedure.                 No gum chewing or hard candies. You may drink clear liquids up to 2 hours                 before you are scheduled to arrive for your surgery- DO NOT drink clear                 liquids within 2 hours of the start of your surgery.                 Clear Liquids include:  water, apple juice without pulp, clear carbohydrate                 drink such as Clearfast or Gatorade, Black Coffee or Tea (Do not add                 milk or creamer to coffee or tea).    __X__2.  On the morning of surgery brush your teeth with toothpaste and water, you may rinse your mouth with mouthwash if you wish.  Do not swallow any toothpaste or mouthwash.      __X__3.  Notify your doctor if there is any change in your medical condition      (cold, fever, infections).       Do not wear jewelry, make-up, hairpins, clips or nail polish. Do not wear lotions, powders, or perfumes.  Do not shave 48 hours prior to surgery. Men may shave face and neck. Do not bring valuables to the hospital.      [facility] is not responsible for any belongings or valuables.   Contacts, dentures/partials or body piercings may not be worn into surgery. Bring a case for your contacts, glasses or hearing aids, a denture cup will be supplied.    Patients discharged the day of surgery will not be allowed to drive home.    __X__ Take these medicines the morning of surgery with A SIP OF WATER:     1.  NONE  2.   3.   4.  5.  6.     __X__ Use SAGE wipes as directed    __X__  May take Tylenol if needed for pain or discomfort.     __X__ Don't start taking any new herbal supplements before your procedure.     ARRIVAL  Report to the [facility] on the morning of your c-section. If your arrival time is before 6:00am, please use your cell phone to call [phone] and someone from Labor and Delivery or Security will greet you and escort you to Labor and Delivery. If your arrival is after 6:00am, you will check in at the screening table with the other [facility] guests.     PARTNER  One dedicated partner is allowed to accompany you throughout your stay. They are allowed to accompany you to the Operating Room as well. They may  leave and return during normal visitors hours but will be required to re-screen with each entry to the medical mall. Partners are not allowed to switch out for a new guest at any point during your hospitalization.

## 2018-10-25 ENCOUNTER — Other Ambulatory Visit: Payer: Self-pay

## 2018-10-25 ENCOUNTER — Other Ambulatory Visit
Admission: RE | Admit: 2018-10-25 | Discharge: 2018-10-25 | Disposition: A | Discharge status: Home / Self Care | Payer: Medicaid Other | Source: Ambulatory Visit | Attending: Obstetrics and Gynecology | Admitting: Obstetrics and Gynecology

## 2018-10-25 DIAGNOSIS — Z01812 Encounter for preprocedural laboratory examination: Secondary | ICD-10-CM | POA: Diagnosis present

## 2018-10-25 DIAGNOSIS — Z20828 Contact with and (suspected) exposure to other viral communicable diseases: Secondary | ICD-10-CM | POA: Insufficient documentation

## 2018-10-25 LAB — SARS CORONAVIRUS 2 (TAT 6-24 HRS): SARS Coronavirus 2: NEGATIVE

## 2018-10-28 ENCOUNTER — Other Ambulatory Visit: Payer: Self-pay

## 2018-10-28 ENCOUNTER — Inpatient Hospital Stay
Admission: RE | Admit: 2018-10-28 | Payer: Medicaid Other | Source: Home / Self Care | Admitting: Obstetrics and Gynecology

## 2018-10-28 ENCOUNTER — Inpatient Hospital Stay
Admission: EM | Admit: 2018-10-28 | Discharge: 2018-10-30 | DRG: 787 | Disposition: A | Discharge status: Home / Self Care | Payer: Medicaid Other | Attending: Obstetrics & Gynecology | Admitting: Obstetrics & Gynecology

## 2018-10-28 ENCOUNTER — Inpatient Hospital Stay: Payer: Medicaid Other | Admitting: Anesthesiology

## 2018-10-28 ENCOUNTER — Encounter
Admission: EM | Disposition: A | Discharge status: Home / Self Care | Payer: Self-pay | Source: Home / Self Care | Attending: Obstetrics & Gynecology

## 2018-10-28 DIAGNOSIS — D62 Acute posthemorrhagic anemia: Secondary | ICD-10-CM | POA: Diagnosis not present

## 2018-10-28 DIAGNOSIS — O9081 Anemia of the puerperium: Secondary | ICD-10-CM | POA: Diagnosis not present

## 2018-10-28 DIAGNOSIS — O4292 Full-term premature rupture of membranes, unspecified as to length of time between rupture and onset of labor: Secondary | ICD-10-CM | POA: Diagnosis present

## 2018-10-28 DIAGNOSIS — O99214 Obesity complicating childbirth: Secondary | ICD-10-CM | POA: Diagnosis present

## 2018-10-28 DIAGNOSIS — Z3A39 39 weeks gestation of pregnancy: Secondary | ICD-10-CM

## 2018-10-28 DIAGNOSIS — O34219 Maternal care for unspecified type scar from previous cesarean delivery: Secondary | ICD-10-CM | POA: Diagnosis not present

## 2018-10-28 DIAGNOSIS — E66813 Obesity, class 3: Secondary | ICD-10-CM | POA: Diagnosis present

## 2018-10-28 DIAGNOSIS — O34211 Maternal care for low transverse scar from previous cesarean delivery: Secondary | ICD-10-CM | POA: Diagnosis present

## 2018-10-28 DIAGNOSIS — E669 Obesity, unspecified: Secondary | ICD-10-CM | POA: Diagnosis present

## 2018-10-28 DIAGNOSIS — O99213 Obesity complicating pregnancy, third trimester: Secondary | ICD-10-CM | POA: Diagnosis present

## 2018-10-28 DIAGNOSIS — O4290 Premature rupture of membranes, unspecified as to length of time between rupture and onset of labor, unspecified weeks of gestation: Secondary | ICD-10-CM | POA: Diagnosis present

## 2018-10-28 DIAGNOSIS — O4202 Full-term premature rupture of membranes, onset of labor within 24 hours of rupture: Secondary | ICD-10-CM

## 2018-10-28 DIAGNOSIS — Z3480 Encounter for supervision of other normal pregnancy, unspecified trimester: Secondary | ICD-10-CM

## 2018-10-28 LAB — RUPTURE OF MEMBRANE (ROM)PLUS: Rom Plus: POSITIVE

## 2018-10-28 LAB — TYPE AND SCREEN
ABO/RH(D): B POS
Antibody Screen: NEGATIVE

## 2018-10-28 LAB — CBC
HCT: 34.4 % — ABNORMAL LOW (ref 36.0–46.0)
Hemoglobin: 11.2 g/dL — ABNORMAL LOW (ref 12.0–15.0)
MCH: 28.5 pg (ref 26.0–34.0)
MCHC: 32.6 g/dL (ref 30.0–36.0)
MCV: 87.5 fL (ref 80.0–100.0)
Platelets: 274 10*3/uL (ref 150–400)
RBC: 3.93 MIL/uL (ref 3.87–5.11)
RDW: 14.1 % (ref 11.5–15.5)
WBC: 8.2 10*3/uL (ref 4.0–10.5)
nRBC: 0 % (ref 0.0–0.2)

## 2018-10-28 SURGERY — Surgical Case
Anesthesia: Epidural

## 2018-10-28 MED ORDER — BUPIVACAINE IN DEXTROSE 0.75-8.25 % IT SOLN
INTRATHECAL | Status: DC | PRN
  Administered 2018-10-28: 1.7 mL via INTRATHECAL

## 2018-10-28 MED ORDER — WITCH HAZEL-GLYCERIN EX PADS: 1.0000 "application " | MEDICATED_PAD | CUTANEOUS | Status: DC | PRN

## 2018-10-28 MED ORDER — DIPHENHYDRAMINE HCL 50 MG/ML IJ SOLN: 12.5000 mg | INTRAMUSCULAR | Status: DC | PRN

## 2018-10-28 MED ORDER — SCOPOLAMINE 1 MG/3DAYS TD PT72
1.0000 | MEDICATED_PATCH | Freq: Once | TRANSDERMAL | Status: DC
  Administered 2018-10-28: 17:00:00 1.5 mg via TRANSDERMAL
  Filled 2018-10-28: qty 1

## 2018-10-28 MED ORDER — OXYTOCIN 40 UNITS IN NORMAL SALINE INFUSION - SIMPLE MED
2.5000 [IU]/h | INTRAVENOUS | Status: AC
  Administered 2018-10-28 (×2): 2.5 [IU]/h via INTRAVENOUS
  Filled 2018-10-28: qty 1000

## 2018-10-28 MED ORDER — NALOXONE HCL 0.4 MG/ML IJ SOLN: 0.4000 mg | INTRAMUSCULAR | Status: DC | PRN

## 2018-10-28 MED ORDER — LACTATED RINGERS IV SOLN
Freq: Once | INTRAVENOUS | Status: AC
  Administered 2018-10-28: 13:00:00 999 mL via INTRAVENOUS

## 2018-10-28 MED ORDER — ZOLPIDEM TARTRATE 5 MG PO TABS: 5.0000 mg | ORAL_TABLET | Freq: Every evening | ORAL | Status: DC | PRN

## 2018-10-28 MED ORDER — DIBUCAINE (PERIANAL) 1 % EX OINT: 1.0000 "application " | TOPICAL_OINTMENT | CUTANEOUS | Status: DC | PRN

## 2018-10-28 MED ORDER — LACTATED RINGERS IV SOLN: INTRAVENOUS | Status: DC

## 2018-10-28 MED ORDER — SENNOSIDES-DOCUSATE SODIUM 8.6-50 MG PO TABS
2.0000 | ORAL_TABLET | ORAL | Status: DC
  Administered 2018-10-29: 2 via ORAL
  Filled 2018-10-28: qty 2

## 2018-10-28 MED ORDER — SIMETHICONE 80 MG PO CHEW: 80.0000 mg | CHEWABLE_TABLET | ORAL | Status: DC | PRN

## 2018-10-28 MED ORDER — SOD CITRATE-CITRIC ACID 500-334 MG/5ML PO SOLN
30.0000 mL | ORAL | Status: AC
  Administered 2018-10-28: 13:00:00 30 mL via ORAL
  Filled 2018-10-28: qty 30

## 2018-10-28 MED ORDER — COCONUT OIL OIL: 1.0000 "application " | TOPICAL_OIL | Status: DC | PRN

## 2018-10-28 MED ORDER — NALBUPHINE HCL 10 MG/ML IJ SOLN
5.0000 mg | INTRAMUSCULAR | Status: DC | PRN
  Administered 2018-10-28: 5 mg via INTRAVENOUS

## 2018-10-28 MED ORDER — BUPIVACAINE HCL (PF) 0.5 % IJ SOLN
INTRAMUSCULAR | Status: AC
  Filled 2018-10-28: qty 30

## 2018-10-28 MED ORDER — KETOROLAC TROMETHAMINE 30 MG/ML IJ SOLN
15.0000 mg | Freq: Four times a day (QID) | INTRAMUSCULAR | Status: AC
  Administered 2018-10-28 – 2018-10-29 (×3): 15 mg via INTRAVENOUS
  Filled 2018-10-28 (×3): qty 1

## 2018-10-28 MED ORDER — ONDANSETRON HCL 4 MG/2ML IJ SOLN: 4.0000 mg | Freq: Once | INTRAMUSCULAR | Status: DC | PRN

## 2018-10-28 MED ORDER — SIMETHICONE 80 MG PO CHEW
80.0000 mg | CHEWABLE_TABLET | Freq: Three times a day (TID) | ORAL | Status: DC
  Administered 2018-10-28 – 2018-10-30 (×5): 80 mg via ORAL
  Filled 2018-10-28 (×5): qty 1

## 2018-10-28 MED ORDER — ACETAMINOPHEN 500 MG PO TABS
1000.0000 mg | ORAL_TABLET | Freq: Four times a day (QID) | ORAL | Status: AC
  Administered 2018-10-28 (×2): 1000 mg via ORAL
  Filled 2018-10-28 (×2): qty 2

## 2018-10-28 MED ORDER — FENTANYL CITRATE (PF) 100 MCG/2ML IJ SOLN
INTRAMUSCULAR | Status: DC | PRN
  Administered 2018-10-28: 20 ug via INTRAVENOUS

## 2018-10-28 MED ORDER — DIPHENHYDRAMINE HCL 25 MG PO CAPS: 25.0000 mg | ORAL_CAPSULE | ORAL | Status: DC | PRN

## 2018-10-28 MED ORDER — MORPHINE SULFATE (PF) 2 MG/ML IV SOLN: 1.0000 mg | INTRAVENOUS | Status: DC | PRN

## 2018-10-28 MED ORDER — TETANUS-DIPHTH-ACELL PERTUSSIS 5-2.5-18.5 LF-MCG/0.5 IM SUSP: 0.5000 mL | Freq: Once | INTRAMUSCULAR | Status: DC

## 2018-10-28 MED ORDER — SIMETHICONE 80 MG PO CHEW: 80.0000 mg | CHEWABLE_TABLET | ORAL | Status: DC

## 2018-10-28 MED ORDER — PHENYLEPHRINE HCL (PRESSORS) 10 MG/ML IV SOLN
INTRAVENOUS | Status: DC | PRN
  Administered 2018-10-28 (×4): 100 ug via INTRAVENOUS

## 2018-10-28 MED ORDER — ACETAMINOPHEN 325 MG PO TABS
650.0000 mg | ORAL_TABLET | ORAL | Status: DC | PRN
  Administered 2018-10-30: 650 mg via ORAL
  Filled 2018-10-28: qty 2

## 2018-10-28 MED ORDER — NALBUPHINE HCL 10 MG/ML IJ SOLN: 5.0000 mg | Freq: Once | INTRAMUSCULAR | Status: DC | PRN

## 2018-10-28 MED ORDER — OXYCODONE-ACETAMINOPHEN 5-325 MG PO TABS
1.0000 | ORAL_TABLET | ORAL | Status: DC | PRN
  Administered 2018-10-29 (×4): 2 via ORAL
  Administered 2018-10-29: 1 via ORAL
  Administered 2018-10-30 (×3): 2 via ORAL
  Filled 2018-10-28 (×6): qty 2
  Filled 2018-10-28: qty 1
  Filled 2018-10-28: qty 2

## 2018-10-28 MED ORDER — OXYTOCIN 40 UNITS IN NORMAL SALINE INFUSION - SIMPLE MED
INTRAVENOUS | Status: AC
  Filled 2018-10-28: qty 1000

## 2018-10-28 MED ORDER — LACTATED RINGERS IV SOLN
INTRAVENOUS | Status: DC | PRN
  Administered 2018-10-28: 13:00:00 via INTRAVENOUS

## 2018-10-28 MED ORDER — FENTANYL CITRATE (PF) 100 MCG/2ML IJ SOLN: 25.0000 ug | INTRAMUSCULAR | Status: DC | PRN

## 2018-10-28 MED ORDER — NALOXONE HCL 4 MG/10ML IJ SOLN
1.0000 ug/kg/h | INTRAVENOUS | Status: DC | PRN
  Filled 2018-10-28: qty 5

## 2018-10-28 MED ORDER — BUPIVACAINE HCL (PF) 0.5 % IJ SOLN: 10.0000 mL | INTRAMUSCULAR | Status: DC

## 2018-10-28 MED ORDER — OXYTOCIN 40 UNITS IN NORMAL SALINE INFUSION - SIMPLE MED
INTRAVENOUS | Status: DC | PRN
  Administered 2018-10-28: 300 mL via INTRAVENOUS

## 2018-10-28 MED ORDER — DIPHENHYDRAMINE HCL 25 MG PO CAPS: 25.0000 mg | ORAL_CAPSULE | Freq: Four times a day (QID) | ORAL | Status: DC | PRN

## 2018-10-28 MED ORDER — NALBUPHINE HCL 10 MG/ML IJ SOLN: 5.0000 mg | INTRAMUSCULAR | Status: DC | PRN

## 2018-10-28 MED ORDER — MENTHOL 3 MG MT LOZG
1.0000 | LOZENGE | OROMUCOSAL | Status: DC | PRN
  Filled 2018-10-28: qty 9

## 2018-10-28 MED ORDER — SODIUM CHLORIDE 0.9% FLUSH: 3.0000 mL | INTRAVENOUS | Status: DC | PRN

## 2018-10-28 MED ORDER — BUPIVACAINE-EPINEPHRINE 0.5% -1:200000 IJ SOLN
INTRAMUSCULAR | Status: DC | PRN
  Administered 2018-10-28: 10 mL

## 2018-10-28 MED ORDER — DEXTROSE 5 % IV SOLN
3.0000 g | INTRAVENOUS | Status: AC
  Administered 2018-10-28: 14:00:00 3 g via INTRAVENOUS
  Filled 2018-10-28: qty 3

## 2018-10-28 MED ORDER — BUPIVACAINE 0.25 % ON-Q PUMP DUAL CATH 400 ML
400.0000 mL | INJECTION | Status: DC
  Filled 2018-10-28: qty 400

## 2018-10-28 MED ORDER — BUPIVACAINE ON-Q PAIN PUMP (FOR ORDER SET NO CHG)
INJECTION | Status: DC
  Filled 2018-10-28: qty 1

## 2018-10-28 MED ORDER — ONDANSETRON HCL 4 MG/2ML IJ SOLN
INTRAMUSCULAR | Status: DC | PRN
  Administered 2018-10-28: 4 mg via INTRAVENOUS

## 2018-10-28 MED ORDER — MEPERIDINE HCL 25 MG/ML IJ SOLN: 6.2500 mg | INTRAMUSCULAR | Status: DC | PRN

## 2018-10-28 MED ORDER — ONDANSETRON HCL 4 MG/2ML IJ SOLN: 4.0000 mg | Freq: Three times a day (TID) | INTRAMUSCULAR | Status: DC | PRN

## 2018-10-28 MED ORDER — EPHEDRINE SULFATE 50 MG/ML IJ SOLN
INTRAMUSCULAR | Status: DC | PRN
  Administered 2018-10-28: 10 mg via INTRAVENOUS

## 2018-10-28 MED ORDER — SODIUM CHLORIDE 0.9 % IV SOLN
INTRAVENOUS | Status: DC | PRN
  Administered 2018-10-28: 14:00:00 25 ug/min via INTRAVENOUS

## 2018-10-28 MED ORDER — PRENATAL MULTIVITAMIN CH
1.0000 | ORAL_TABLET | Freq: Every day | ORAL | Status: DC
  Administered 2018-10-29: 1 via ORAL
  Filled 2018-10-28: qty 1

## 2018-10-28 SURGICAL SUPPLY — 30 items
BAG COUNTER SPONGE EZ (MISCELLANEOUS) ×2 IMPLANT
CANISTER SUCT 3000ML PPV (MISCELLANEOUS) ×2 IMPLANT
CATH KIT ON-Q SILVERSOAK 5 (CATHETERS) ×2 IMPLANT
CATH KIT ON-Q SILVERSOAK 5IN (CATHETERS) ×4 IMPLANT
CHLORAPREP W/TINT 26 (MISCELLANEOUS) ×4 IMPLANT
DERMABOND ADVANCED (GAUZE/BANDAGES/DRESSINGS) ×1
DERMABOND ADVANCED .7 DNX12 (GAUZE/BANDAGES/DRESSINGS) ×1 IMPLANT
DRSG OPSITE POSTOP 4X10 (GAUZE/BANDAGES/DRESSINGS) ×2 IMPLANT
DRSG TELFA 3X8 NADH (GAUZE/BANDAGES/DRESSINGS) ×2 IMPLANT
ELECT CAUTERY BLADE 6.4 (BLADE) ×1 IMPLANT
ELECT REM PT RETURN 9FT ADLT (ELECTROSURGICAL) ×2
ELECTRODE REM PT RTRN 9FT ADLT (ELECTROSURGICAL) ×1 IMPLANT
GAUZE SPONGE 4X4 12PLY STRL (GAUZE/BANDAGES/DRESSINGS) ×2 IMPLANT
GLOVE BIO SURGEON STRL SZ7 (GLOVE) ×4 IMPLANT
GLOVE INDICATOR 7.5 STRL GRN (GLOVE) ×4 IMPLANT
GOWN STRL REUS W/ TWL LRG LVL3 (GOWN DISPOSABLE) ×3 IMPLANT
GOWN STRL REUS W/TWL LRG LVL3 (GOWN DISPOSABLE) ×3
NS IRRIG 1000ML POUR BTL (IV SOLUTION) ×2 IMPLANT
PACK C SECTION AR (MISCELLANEOUS) ×2 IMPLANT
PAD DRESSING TELFA 3X8 NADH (GAUZE/BANDAGES/DRESSINGS) ×1 IMPLANT
PAD OB MATERNITY 4.3X12.25 (PERSONAL CARE ITEMS) ×2 IMPLANT
PAD PREP 24X41 OB/GYN DISP (PERSONAL CARE ITEMS) ×2 IMPLANT
PENCIL SMOKE ULTRAEVAC 22 CON (MISCELLANEOUS) ×2 IMPLANT
STRIP CLOSURE SKIN 1/2X4 (GAUZE/BANDAGES/DRESSINGS) ×2 IMPLANT
SUT MNCRL AB 4-0 PS2 18 (SUTURE) ×2 IMPLANT
SUT PDS AB 1 TP1 96 (SUTURE) ×4 IMPLANT
SUT PLAIN 2 0 XLH (SUTURE) ×1 IMPLANT
SUT VIC AB 0 CTX 36 (SUTURE) ×2
SUT VIC AB 0 CTX36XBRD ANBCTRL (SUTURE) ×2 IMPLANT
SUT VIC AB 2-0 CT1 36 (SUTURE) ×1 IMPLANT

## 2018-10-28 NOTE — H&P (Addendum)
Obstetrics Admission History & Physical   CC: SROM  HPI:  28 y.o. G2P1001 @ 106w3d (11/01/2018, by Last Menstrual Period). Admitted on 10/28/2018:   Patient Active Problem List   Diagnosis Date Noted  . Maternal care for scar from previous cesarean delivery 10/28/2018  . Obesity affecting pregnancy in third trimester, antepartum 10/17/2018  . Previous cesarean delivery affecting pregnancy, antepartum 03/29/2018  . Supervision of other normal pregnancy, antepartum 03/29/2018     Presents for SROM at 0700, followed by irregular contraction pains. Planned for repeat CS tomorrow.   Prenatal care at: at [facility]. Pregnancy complicated by obesity, prior CS.  ROS: A review of systems was performed and negative, except as stated in the above HPI. PMHx: History reviewed. No pertinent past medical history. PSHx:  Past Surgical History:  Procedure Laterality Date  . CESAREAN SECTION     Medications:  No medications prior to admission.   Allergies: has No Known Allergies. OBHx:  OB History  Gravida Para Term Preterm AB Living  2 1 1    0 1  SAB TAB Ectopic Multiple Live Births    0          # Outcome Date GA Lbr Len/2nd Weight Sex Delivery Anes PTL Lv  2 Current           1 Term 06/16/11 [redacted]w[redacted]d  2327 g F CS-Unspec      FAO:ZHYQMVHQ/[id number] except as detailed in HPI.Marland Kitchen  No family history of birth defects. Soc Hx: Alcohol: none and Recreational drug use: none  Objective:   Vitals:   10/28/18 1058 10/28/18 1059  BP:  123/74  Pulse:  89  Resp: 16   Temp: 98.6 F (37 C)    Constitutional: Well nourished, well developed female in no acute distress.  HEENT: normal Skin: Warm and dry.  Cardiovascular:Regular rate and rhythm.   Extremity: trace to 1+ bilateral pedal edema Respiratory: Clear to auscultation bilateral. Normal respiratory effort Abdomen: gravid, ND, FHT present, moderate tenderness on exam Back: no CVAT Neuro: DTRs 2+, Cranial nerves grossly intact Psych: Alert  and Oriented x3. No memory deficits. Normal mood and affect.  MS: normal gait, normal bilateral lower extremity ROM/strength/stability.  EFM:FHR: 140 bpm, variability: moderate,  accelerations:  Present,  decelerations:  Absent Toco: Frequency: Every 5-12 minutes   Perinatal info:  Blood type: B positive Rubella- Immune Varicella -Immune TDaP Given during third trimester of this pregnancy RPR NR / HIV Neg/ HBsAg Neg   Assessment & Plan:   28 y.o. G2P1001 @ [redacted]w[redacted]d, Admitted on 10/28/2018:PROM in early labor, for repeat CS as does not desires VBAC Does not desire BTL  Clinic [facility] Prenatal Labs  Dating LMP = [redacted]w[redacted]d [place] Blood type: B/Positive/-- (03/02 1009)   Genetic Screen Declined 04/08/2018 Antibody:Negative (03/02 1009)  Anatomic [place] complete Rubella: 1.19 (03/02 1009) Varicella:  Immune  GTT Early:  118             Third trimester: 118 RPR: Non Reactive (03/02 1009)   Rhogam  Positive HBsAg: Negative (03/02 1009)   TDaP vaccine 08/19/2018                       Flu Shot:Declines HIV: Non Reactive (03/02 1009)   Baby Food   Bottle fed other child     GBS: Negative  Contraception  None Pap:06/07/2016 NIL   CBB  No   CS/VBAC  Desires repeat C/S Scheduled 10/29/2018   Support Person Mother  The risks of cesarean section discussed with the patient included but were not limited to: bleeding which may require transfusion or reoperation; infection which may require antibiotics; injury to bowel, bladder, ureters or other surrounding organs; injury to the fetus; need for additional procedures including hysterectomy in the event of a life-threatening hemorrhage; placental abnormalities wth subsequent pregnancies, incisional problems, thromboembolic phenomenon and other postoperative/anesthesia complications. The patient concurred with the proposed plan, giving informed written consent for the procedure.    Annamarie Major, MD, [facility], [facility] 10/28/2018   12:13 PM

## 2018-10-28 NOTE — Transfer of Care (Signed)
Immediate Anesthesia Transfer of Care Note  Patient: Erika Wiley  Procedure(s) Performed: CESAREAN SECTION (N/A )  Patient Location: PACU  Anesthesia Type:Spinal  Level of Consciousness: awake, alert  and oriented  Airway & Oxygen Therapy: Patient Spontanous Breathing  Post-op Assessment: Post -op Vital signs reviewed and stable  Post vital signs: stable  Last Vitals:  Vitals Value Taken Time  BP 114/59 10/28/18 1442  Temp    Pulse 66 10/28/18 1442  Resp    SpO2 100 % 10/28/18 1442    Last Pain:  Vitals:   10/28/18 1100  TempSrc:   PainSc: 5          Complications: No apparent anesthesia complications

## 2018-10-28 NOTE — Anesthesia Preprocedure Evaluation (Signed)
Anesthesia Evaluation  Patient identified by MRN, date of birth, ID band Patient awake    Reviewed: H&P , NPO status , Patient's Chart, lab work & pertinent test results, reviewed documented beta blocker date and time   Airway Mallampati: II  TM Distance: >3 FB Neck ROM: full    Dental no notable dental hx. (+) Teeth Intact   Pulmonary neg pulmonary ROS, Current Smoker,    Pulmonary exam normal breath sounds clear to auscultation       Cardiovascular Exercise Tolerance: Good negative cardio ROS   Rhythm:regular Rate:Normal     Neuro/Psych negative neurological ROS  negative psych ROS   GI/Hepatic negative GI ROS, Neg liver ROS,   Endo/Other  negative endocrine ROSdiabetes  Renal/GU      Musculoskeletal   Abdominal   Peds  Hematology negative hematology ROS (+)   Anesthesia Other Findings   Reproductive/Obstetrics (+) Pregnancy                             Anesthesia Physical Anesthesia Plan  ASA: III  Anesthesia Plan: Epidural   Post-op Pain Management:    Induction:   PONV Risk Score and Plan:   Airway Management Planned:   Additional Equipment:   Intra-op Plan:   Post-operative Plan:   Informed Consent: I have reviewed the patients History and Physical, chart, labs and discussed the procedure including the risks, benefits and alternatives for the proposed anesthesia with the patient or authorized representative who has indicated his/her understanding and acceptance.       Plan Discussed with:   Anesthesia Plan Comments:         Anesthesia Quick Evaluation

## 2018-10-28 NOTE — Anesthesia Post-op Follow-up Note (Signed)
Anesthesia QCDR form completed.        

## 2018-10-28 NOTE — Op Note (Signed)
Cesarean Section Procedure Note Indications: PROM in labor, prior cesarean section and term intrauterine pregnancy  Pre-operative Diagnosis: Intrauterine pregnancy [redacted]w[redacted]d ;  PROM in labor, prior cesarean section and term intrauterine pregnancy Post-operative Diagnosis: same, delivered. Procedure: Low Transverse Cesarean Section Surgeon: Barnett Applebaum, MD, FACOG Assistant(s): Deeann Saint, CNM, No other capable assistant available, in surgery requiring high level assistant. Anesthesia: Spinal anesthesia Estimated Blood DGUY:[id number]  Complications: None; patient tolerated the procedure well. Disposition: PACU - hemodynamically stable. Condition: stable  Findings: A female infant in the cephalic presentation. OP.  "Kenan" Amniotic fluid - Clear  Birth weight 7-6 lbs.  Apgars of 8 and 8.  Intact placenta with a three-vessel cord. Grossly normal uterus, tubes and ovaries bilaterally. Minimal intraabdominal adhesions were noted.  Procedure Details   The patient was taken to Operating Room, identified as the correct patient and the procedure verified as C-Section Delivery. A Time Out was held and the above information confirmed. After induction of anesthesia, the patient was draped and prepped in the usual sterile manner. A Pfannenstiel incision was made and carried down through the subcutaneous tissue to the fascia. Fascial incision was made and extended transversely with the Mayo scissors. The fascia was separated from the underlying rectus tissue superiorly and inferiorly. The peritoneum was identified and entered bluntly. Peritoneal incision was extended longitudinally. The utero-vesical peritoneal reflection was incised transversely and a bladder flap was created digitally.  A low transverse hysterotomy was made. The fetus was delivered atraumatically. The umbilical cord was clamped x2 and cut and the infant was handed to the awaiting pediatricians. The placenta was removed intact and appeared normal with  a 3-vessel cord.  The uterus was exteriorized and cleared of all clot and debris. The hysterotomy was closed with running sutures of 0 Vicryl suture. A second imbricating layer was placed with the same suture. Excellent hemostasis was observed. The uterus was returned to the abdomen. The pelvis was irrigated and again, excellent hemostasis was noted.  The On Q Pain pump System was then placed.  Trocars were placed through the abdominal wall into the subfascial space and these were used to thread the silver soaker cathaters into place.The rectus fascia was then reapproximated with running sutures of Maxon, with careful placement not to incorporate the cathaters. Subcutaneous tissues are then irrigated with saline and hemostasis assured.  Skin is then closed with 4-0 vicryl suture in a subcuticular fashion followed by skin adhesive. The cathaters are flushed each with 5 mL of Bupivicaine and stabilized into place with dressing. Instrument, sponge, and needle counts were correct prior to the abdominal closure and at the conclusion of the case.  The patient tolerated the procedure well and was transferred to the recovery room in stable condition.   Barnett Applebaum, MD, [facility], [facility] 10/28/2018  2:34 PM

## 2018-10-28 NOTE — Anesthesia Procedure Notes (Signed)
Spinal  Start time: 10/28/2018 1:25 PM End time: 10/28/2018 1:35 PM Staffing Anesthesiologist: Molli Barrows, MD Resident/CRNA: Aline Brochure, CRNA Performed: resident/CRNA and anesthesiologist  Preanesthetic Checklist Completed: patient identified, site marked, surgical consent, pre-op evaluation, IV checked, risks and benefits discussed and monitors and equipment checked Spinal Block Patient position: sitting Prep: ChloraPrep Patient monitoring: continuous pulse ox, blood pressure and heart rate Approach: midline Location: L3-4 Injection technique: single-shot Needle Needle gauge: 25 G

## 2018-10-29 ENCOUNTER — Encounter: Payer: Self-pay | Admitting: Obstetrics & Gynecology

## 2018-10-29 LAB — RPR: RPR Ser Ql: NONREACTIVE

## 2018-10-29 LAB — CBC
HCT: 28.3 % — ABNORMAL LOW (ref 36.0–46.0)
Hemoglobin: 9.3 g/dL — ABNORMAL LOW (ref 12.0–15.0)
MCH: 28.5 pg (ref 26.0–34.0)
MCHC: 32.9 g/dL (ref 30.0–36.0)
MCV: 86.8 fL (ref 80.0–100.0)
Platelets: 220 10*3/uL (ref 150–400)
RBC: 3.26 MIL/uL — ABNORMAL LOW (ref 3.87–5.11)
RDW: 13.7 % (ref 11.5–15.5)
WBC: 8.5 10*3/uL (ref 4.0–10.5)
nRBC: 0 % (ref 0.0–0.2)

## 2018-10-29 MED ORDER — IBUPROFEN 600 MG PO TABS
600.0000 mg | ORAL_TABLET | Freq: Four times a day (QID) | ORAL | Status: DC | PRN
  Administered 2018-10-29 – 2018-10-30 (×4): 600 mg via ORAL
  Filled 2018-10-29 (×7): qty 1

## 2018-10-29 NOTE — Anesthesia Postprocedure Evaluation (Signed)
Anesthesia Post Note  Patient: Erika Wiley  Procedure(s) Performed: CESAREAN SECTION (N/A )  Patient location during evaluation: PACU Anesthesia Type: Epidural Level of consciousness: awake and alert Pain management: pain level controlled Vital Signs Assessment: post-procedure vital signs reviewed and stable Respiratory status: spontaneous breathing, nonlabored ventilation, respiratory function stable and patient connected to nasal cannula oxygen Cardiovascular status: blood pressure returned to baseline and stable Postop Assessment: no apparent nausea or vomiting Anesthetic complications: no     Last Vitals:  Vitals:   10/29/18 0745 10/29/18 0900  BP: 118/65   Pulse: 85 96  Resp: 20   Temp: 37.1 C   SpO2: 100% 100%    Last Pain:  Vitals:   10/29/18 0915  TempSrc:   PainSc: [place] Kiante Petrovich

## 2018-10-29 NOTE — Progress Notes (Signed)
Obstetric Postpartum/PostOperative Daily Progress Note Subjective:  28 y.o. [id number] post-operative day # 1 status post repeat cesarean delivery.  She is not yet ambulating, is tolerating po, is not yet voiding spontaneously.  Her pain is well controlled on PO pain medications. Her lochia is less than menses.   Medications SCHEDULED MEDICATIONS  . acetaminophen  1,000 mg Oral Q6H  . ketorolac  15 mg Intravenous Q6H  . prenatal multivitamin  1 tablet Oral Q1200  . scopolamine  1 patch Transdermal Once  . senna-docusate  2 tablet Oral Q24H  . simethicone  80 mg Oral TID PC  . simethicone  80 mg Oral Q24H  . Tdap  0.5 mL Intramuscular Once    MEDICATION INFUSIONS  . bupivacaine 0.25 % ON-Q pump DUAL CATH 400 mL    . bupivacaine ON-Q pain pump    . lactated ringers    . naLOXone [facility]) adult infusion for PRURITIS      PRN MEDICATIONS  acetaminophen, coconut oil, witch hazel-glycerin **AND** dibucaine, diphenhydrAMINE **OR** diphenhydrAMINE, ibuprofen, menthol-cetylpyridinium, morphine injection, nalbuphine **OR** nalbuphine, nalbuphine **OR** nalbuphine, naloxone **AND** sodium chloride flush, naLOXone (NARCAN) adult infusion for PRURITIS, ondansetron (ZOFRAN) IV, oxyCODONE-acetaminophen, simethicone, zolpidem    Objective:   Vitals:   10/28/18 2331 10/29/18 0354 10/29/18 0700 10/29/18 0745  BP: (!) 129/59 (!) 119/52  118/65  Pulse: 62 73 74 85  Resp: 18 18  20   Temp: 98.2 F (36.8 C) 98.6 F (37 C)  98.8 F (37.1 C)  TempSrc: Oral Oral  Oral  SpO2: 100% 100% 100% 100%  Weight:      Height:        Current Vital Signs 24h Vital Sign Ranges  T 98.8 F (37.1 C) Temp  Avg: 98.2 F (36.8 C)  Min: 97.7 F (36.5 C)  Max: 98.8 F (37.1 C)  BP 118/65 BP  Min: 89/64  Max: 134/58  HR 85 Pulse  Avg: 64.2  Min: 38  Max: 104  RR 20 Resp  Avg: 18.4  Min: 14  Max: 25  SaO2 100 % Room Air SpO2  Avg: 99.9 %  Min: 95 %  Max: 100 %       24 Hour I/O Current Shift I/O  Time Ins Outs  09/21 0701 - 09/22 0700 In: 2365 [P.O.:840; I.V.:925] Out: 1370 [Urine:1000] 09/22 0701 - 09/22 1900 In: 525 [I.V.:525] Out: -   General: NAD Pulmonary: no increased work of breathing Abdomen: non-distended, non-tender, fundus firm at level of umbilicus Inc: Clean/dry/intact Extremities: no edema, no erythema, no tenderness  Labs:  Recent Labs  Lab 10/28/18 1235 10/29/18 0510  WBC 8.2 8.5  HGB 11.2* 9.3*  HCT 34.4* 28.3*  PLT 274 220     Assessment:   28 y.o. G2P2002 postoperative day # 1 status post repeat cesarean section  Plan:  1) Acute blood loss anemia - hemodynamically stable and asymptomatic - po ferrous sulfate  2) B POS / Rubella 1.19 (03/02 1009)/ Varicella Immune  3) TDAP status given antepartum  4) Formula feeding/Contraception = undecided  5) Disposition: continue routine post c/section care   Rod Can, CNM 10/29/2018 9:37 AM

## 2018-10-30 NOTE — Discharge Summary (Signed)
OB Discharge Summary     Patient Name: Erika Wiley DOB: [birth year] MRN: [id number]  Date of admission: 10/28/2018 Delivering MD: Barnett Applebaum  Date of Delivery: 10/28/2018  Date of discharge: 10/30/2018  Admitting diagnosis: 40wks Intrauterine pregnancy: [redacted]w[redacted]d     Secondary diagnosis: PROM     Discharge diagnosis: Term Pregnancy Delivered                                                                                                Post partum procedures:none  Augmentation: n/a  Complications: None  Hospital course:  PROM/labor with Unplanned C/S  28 y.o. yo G2P2002 at [redacted]w[redacted]d was admitted in early labor on 10/28/2018. Membrane Rupture Time/Date: 7:50 AM ,10/28/2018   The patient went for cesarean section due to Elective Repeat, and delivered a Viable infant,10/28/2018  Details of operation can be found in separate operative note. Patient had an uncomplicated postpartum course.  She is ambulating,tolerating a regular diet, passing flatus, and urinating well.  Patient is discharged home in stable condition 10/30/18.  Physical exam  Vitals:   10/29/18 1204 10/29/18 1526 10/29/18 2326 10/30/18 0900  BP: 140/78 130/66 139/62 132/60  Pulse: 90 80 76 68  Resp: 18 18 18 18   Temp: 98.7 F (37.1 C) 99 F (37.2 C) 98.4 F (36.9 C) 98.6 F (37 C)  TempSrc: Oral Oral Oral Oral  SpO2: 99% 99% 98% 100%  Weight:      Height:       General: alert, cooperative and no distress Lochia: appropriate Uterine Fundus: firm Incision: Healing well with no significant drainage, Dressing is clean, dry, and intact DVT Evaluation: No evidence of DVT seen on physical exam.  Labs: Lab Results  Component Value Date   WBC 8.5 10/29/2018   HGB 9.3 (L) 10/29/2018   HCT 28.3 (L) 10/29/2018   MCV 86.8 10/29/2018   PLT 220 10/29/2018    Discharge instruction: per After Visit Summary.  Medications:  Allergies as of 10/30/2018   No Known Allergies     Medication List    You have not been  prescribed any medications.          Discharge Care Instructions  (From admission, onward)         Start     Ordered   10/30/18 0000  Discharge wound care:    Comments: Keep incision dry, clean.   10/30/18 0912          Diet: routine diet  Activity: Advance as tolerated. Pelvic rest for 6 weeks.   Outpatient follow up: Follow-up Information    Gae Dry, MD. Go in 1 week(s).   Specialty: Obstetrics and Gynecology Why: for postop check Contact information: [address] [place] [address] [phone]             Postpartum contraception: Combination OCPs Rhogam Given postpartum: NA Rubella vaccine given postpartum: Immune Varicella vaccine given postpartum: Immune TDaP given antepartum or postpartum: given antepartum  Newborn Data: Live born female Kenan Birth Weight: 7 lb 6.2 oz (3350 g) APGAR: 8, 8  Newborn Delivery   Birth date/time:  10/28/2018 13:53:00 Delivery type: C-Section, Low Transverse Trial of labor: No C-section categorization: Repeat       Baby Feeding: formula  Disposition:home with mother  SIGNED:  Tresea Mall, CNM 10/30/2018 9:13 AM

## 2018-10-30 NOTE — Progress Notes (Signed)
DC teaching completed with mom.  Questions answered.  Pt refused flu vaccine.  Pt verb u/o of how to remove On Q pump on Fri or Sat.  Verb u/o of f/u appointments.

## 2018-10-30 NOTE — Discharge Instructions (Signed)
Discharge Instructions:   Follow-up Appointment: Go to 1 week post-op incision check visit with Dr Tiburcio Pea. Schedule an appointment ASAP for a postpartum visit in 6 weeks with Dr. Tiburcio Pea!   If there are any new medications, they have been ordered and will be available for pickup at the listed pharmacy on your way home from the hospital.   Call office if you have any of the following: headache, visual changes, fever >101.0 F, chills, shortness of breath, breast concerns, excessive vaginal bleeding, incision drainage or problems, leg pain or redness, depression or any other concerns. If you have vaginal discharge with an odor, let your doctor know.   It is normal to bleed for up to 6 weeks. You should not soak through more than 1 pad in 1 hour. If you have a blood clot larger than your fist with continued bleeding, call your doctor.   After a c-section, you should expect a small amount of blood or clear fluid coming from the incision and abdominal cramping/soreness. Inspect your incision site daily. Stand in front of a mirror to look for any redness, incision opening, or discolored/odorness drainage. Take a shower daily and continue good hygiene. Use own towel and washcloth (do not share). Make sure your sheets on your bed are clean. No pets sleeping around your incision site. Dressing will be removed at your postpartum visit. If the dressing does become wet or soiled underneath, it is okay to remove it.   On-Q pump: You will remove on day 5 after insertion or if the ball becomes flat before day 5. You will remove on: Saturday, September 26th  Activity: Do not lift > 10 lbs for 6 weeks (do not lift anything heavier than your baby). No intercourse, tampons, swimming pools, hot tubs, baths (only showers) for 6 weeks.  No driving for 1-2 weeks. Continue prenatal vitamin, especially if breastfeeding. Increase calories and fluids (water) while breastfeeding.   Your milk will come in, in the next couple  of days (right now it is colostrum). You may have a slight fever when your milk comes in, but it should go away on its own.  If it does not, and rises above 101 F please call the doctor. You will also feel achy and your breasts will be firm. They will also start to leak. If you are breastfeeding, continue as you have been and you can pump/express milk for comfort.   If you have too much milk, your breasts can become engorged, which could lead to mastitis. This is an infection of the milk ducts. It can be very painful and you will need to notify your doctor to obtain a prescription for antibiotics. You can also treat it with a shower or hot/cold compress.   For concerns about your baby, please call your pediatrician.  For breastfeeding concerns, the lactation consultant can be reached at [phone].   Postpartum blues (feelings of happy one minute and sad another minute) are normal for the first few weeks but if it gets worse let your doctor know.   Congratulations! We enjoyed caring for you and your new bundle of joy!    Cesarean Delivery, Care After This sheet gives you information about how to care for yourself after your procedure. Your health care provider may also give you more specific instructions. If you have problems or questions, contact your health care provider. What can I expect after the procedure? After the procedure, it is common to have:  A small amount of blood  or clear fluid coming from the incision.  Some redness, swelling, and pain in your incision area.  Some abdominal pain and soreness.  Vaginal bleeding (lochia). Even though you did not have a vaginal delivery, you will still have vaginal bleeding and discharge.  Pelvic cramps.  Fatigue. You may have pain, swelling, and discomfort in the tissue between your vagina and your anus (perineum) if:  Your C-section was unplanned, and you were allowed to labor and push.  An incision was made in the area (episiotomy)  or the tissue tore during attempted vaginal delivery. Follow these instructions at home: Incision care   Follow instructions from your health care provider about how to take care of your incision. Make sure you: ? Wash your hands with soap and water before you change your bandage (dressing). If soap and water are not available, use hand sanitizer. ? If you have a dressing, change it or remove it as told by your health care provider. ? Leave stitches (sutures), skin staples, skin glue, or adhesive strips in place. These skin closures may need to stay in place for 2 weeks or longer. If adhesive strip edges start to loosen and curl up, you may trim the loose edges. Do not remove adhesive strips completely unless your health care provider tells you to do that.  Check your incision area every day for signs of infection. Check for: ? More redness, swelling, or pain. ? More fluid or blood. ? Warmth. ? Pus or a bad smell.  Do not take baths, swim, or use a hot tub until your health care provider says it's okay. Ask your health care provider if you can take showers.  When you cough or sneeze, hug a pillow. This helps with pain and decreases the chance of your incision opening up (dehiscing). Do this until your incision heals. Medicines  Take over-the-counter and prescription medicines only as told by your health care provider.  If you were prescribed an antibiotic medicine, take it as told by your health care provider. Do not stop taking the antibiotic even if you start to feel better.  Do not drive or use heavy machinery while taking prescription pain medicine. Lifestyle  Do not drink alcohol. This is especially important if you are breastfeeding or taking pain medicine.  Do not use any products that contain nicotine or tobacco, such as cigarettes, e-cigarettes, and chewing tobacco. If you need help quitting, ask your health care provider. Eating and drinking  Drink at least 8 eight-ounce  glasses of water every day unless told not to by your health care provider. If you breastfeed, you may need to drink even more water.  Eat high-fiber foods every day. These foods may help prevent or relieve constipation. High-fiber foods include: ? Whole grain cereals and breads. ? Brown rice. ? Beans. ? Fresh fruits and vegetables. Activity   If possible, have someone help you care for your baby and help with household activities for at least a few days after you leave the hospital.  Return to your normal activities as told by your health care provider. Ask your health care provider what activities are safe for you.  Rest as much as possible. Try to rest or take a nap while your baby is sleeping.  Do not lift anything that is heavier than 10 lbs (4.5 kg), or the limit that you were told, until your health care provider says that it is safe.  Talk with your health care provider about when you can  engage in sexual activity. This may depend on your: ? Risk of infection. ? How fast you heal. ? Comfort and desire to engage in sexual activity. General instructions  Do not use tampons or douches until your health care provider approves.  Wear loose, comfortable clothing and a supportive and well-fitting bra.  Keep your perineum clean and dry. Wipe from front to back when you use the toilet.  If you pass a blood clot, save it and call your health care provider to discuss. Do not flush blood clots down the toilet before you get instructions from your health care provider.  Keep all follow-up visits for you and your baby as told by your health care provider. This is important. Contact a health care provider if:  You have: ? A fever. ? Bad-smelling vaginal discharge. ? Pus or a bad smell coming from your incision. ? Difficulty or pain when urinating. ? A sudden increase or decrease in the frequency of your bowel movements. ? More redness, swelling, or pain around your incision. ? More  fluid or blood coming from your incision. ? A rash. ? Nausea. ? Little or no interest in activities you used to enjoy. ? Questions about caring for yourself or your baby.  Your incision feels warm to the touch.  Your breasts turn red or become painful or hard.  You feel unusually sad or worried.  You vomit.  You pass a blood clot from your vagina.  You urinate more than usual.  You are dizzy or light-headed. Get help right away if:  You have: ? Pain that does not go away or get better with medicine. ? Chest pain. ? Difficulty breathing. ? Blurred vision or spots in your vision. ? Thoughts about hurting yourself or your baby. ? New pain in your abdomen or in one of your legs. ? A severe headache.  You faint.  You bleed from your vagina so much that you fill more than one sanitary pad in one hour. Bleeding should not be heavier than your heaviest period. Summary  After the procedure, it is common to have pain at your incision site, abdominal cramping, and slight bleeding from your vagina.  Check your incision area every day for signs of infection.  Tell your health care provider about any unusual symptoms.  Keep all follow-up visits for you and your baby as told by your health care provider. This information is not intended to replace advice given to you by your health care provider. Make sure you discuss any questions you have with your health care provider. Document Released: 10/15/2001 Document Revised: 08/01/2017 Document Reviewed: 08/01/2017 Elsevier Patient Education  2020 [employer].

## 2018-11-01 ENCOUNTER — Other Ambulatory Visit: Payer: Self-pay

## 2018-11-01 ENCOUNTER — Encounter: Payer: Self-pay | Admitting: Obstetrics & Gynecology

## 2018-11-01 ENCOUNTER — Ambulatory Visit (INDEPENDENT_AMBULATORY_CARE_PROVIDER_SITE_OTHER): Payer: Medicaid Other | Admitting: Obstetrics & Gynecology

## 2018-11-01 MED ORDER — NORGESTIMATE-ETH ESTRADIOL 0.25-35 MG-MCG PO TABS: 1.0000 | ORAL_TABLET | Freq: Every day | ORAL | 11 refills | Status: DC

## 2018-11-01 NOTE — Progress Notes (Signed)
  Postoperative Follow-up Patient presents post op from recent Cesarean Section performed for Elective repeat, 4 days ago.   Subjective: Patient reports some improvement in her immediate post op symptoms. Eating a regular diet without difficulty. Pain is controlled with current analgesics. Medications being used: acetaminophen and ibuprofen (OTC).  Activity: sedentary. Patient reports additional symptom's since surgery of appropriate lochia, no signs of depression, and no signs of mastitis.  Objective: BP (!) 150/90   Ht 5\' 5"  (1.651 m)   Wt 277 lb (125.6 kg)   LMP 01/25/2018 Comment: depoprovera  BMI 46.10 kg/m  Physical Exam Constitutional:      General: She is not in acute distress.    Appearance: She is well-developed.  Cardiovascular:     Rate and Rhythm: Normal rate.  Pulmonary:     Effort: Pulmonary effort is normal.  Abdominal:     General: There is no distension.     Palpations: Abdomen is soft.     Tenderness: There is no abdominal tenderness.     Comments: Incision Healing Well   Musculoskeletal: Normal range of motion.  Neurological:     Mental Status: She is alert and oriented to person, place, and time.     Cranial Nerves: No cranial nerve deficit.  Skin:    General: Skin is warm and dry.     Assessment: s/p : Cesarean Section for Elective repeat stable  Plan: Patient has done well after her Cesarean Section with no apparent complications.  I have discussed the post-operative course to date, and the expected progress moving forward.  The patient understands what complications to be concerned about.  I will see the patient in routine follow up, or sooner if needed.    Activity plan: No heavy lifting.Marland Kitchen  Pelvic rest. She desires oral contraceptives (estrogen/progesterone) for postpartum contraception.  Hoyt Koch 11/01/2018, 10:39 AM

## 2018-11-05 ENCOUNTER — Encounter: Payer: Self-pay | Admitting: Obstetrics & Gynecology

## 2018-12-09 ENCOUNTER — Other Ambulatory Visit (HOSPITAL_COMMUNITY)
Admission: RE | Admit: 2018-12-09 | Discharge: 2018-12-09 | Disposition: A | Discharge status: Home / Self Care | Payer: Medicaid Other | Source: Ambulatory Visit | Attending: Obstetrics & Gynecology | Admitting: Obstetrics & Gynecology

## 2018-12-09 ENCOUNTER — Other Ambulatory Visit: Payer: Self-pay

## 2018-12-09 ENCOUNTER — Ambulatory Visit (INDEPENDENT_AMBULATORY_CARE_PROVIDER_SITE_OTHER): Payer: Medicaid Other | Admitting: Obstetrics & Gynecology

## 2018-12-09 ENCOUNTER — Encounter: Payer: Self-pay | Admitting: Obstetrics & Gynecology

## 2018-12-09 DIAGNOSIS — Z30011 Encounter for initial prescription of contraceptive pills: Secondary | ICD-10-CM

## 2018-12-09 DIAGNOSIS — Z124 Encounter for screening for malignant neoplasm of cervix: Secondary | ICD-10-CM | POA: Insufficient documentation

## 2018-12-09 DIAGNOSIS — Z1389 Encounter for screening for other disorder: Secondary | ICD-10-CM

## 2018-12-09 MED ORDER — NORGESTIMATE-ETH ESTRADIOL 0.25-35 MG-MCG PO TABS: 1.0000 | ORAL_TABLET | Freq: Every day | ORAL | 11 refills | Status: AC

## 2018-12-09 NOTE — Progress Notes (Signed)
  OBSTETRICS POSTPARTUM CLINIC PROGRESS NOTE  Subjective:     Erika Wiley is a 28 y.o. [id number] female who presents for a postpartum visit. She is 6 weeks postpartum following a Term pregnancy and delivery by C-section repeat; no problems after deliver.  I have fully reviewed the prenatal and intrapartum course. Anesthesia: spinal.  Postpartum course has been complicated by uncomplicated.  Baby is feeding by Bottle.  Bleeding: patient has not  resumed menses.  Bowel function is normal. Bladder function is normal.  Patient is not sexually active. Contraception method desired is OCP (estrogen/progesterone).  Postpartum depression screening: negative. [place] 0.  The following portions of the patient's history were reviewed and updated as appropriate: allergies, current medications, past family history, past medical history, past social history, past surgical history and problem list.  Review of Systems Pertinent items are noted in HPI.  Objective:    BP 140/90   Ht 5\' 5"  (1.651 m)   Wt 260 lb (117.9 kg)   LMP 11/30/2018 Comment: depoprovera  BMI 43.27 kg/m   General:  alert and no distress   Breasts:  inspection negative, no nipple discharge or bleeding, no masses or nodularity palpable  Lungs: clear to auscultation bilaterally  Heart:  regular rate and rhythm, S1, S2 normal, no murmur, click, rub or gallop  Abdomen: soft, non-tender; bowel sounds normal; no masses,  no organomegaly.  Well healed Pfannenstiel incision   Vulva:  normal  Vagina: normal vagina, no discharge, exudate, lesion, or erythema  Cervix:  no cervical motion tenderness and no lesions  Corpus: normal size, contour, position, consistency, mobility, non-tender  Adnexa:  normal adnexa and no mass, fullness, tenderness  Rectal Exam: Not performed.          Assessment:  Post Partum Care visit 1. Postpartum care following cesarean delivery  2. Screening for cervical cancer - Cytology - PAP  3.  Encounter for initial prescription of contraceptive pills - OCPs   Plan:  See orders and Patient Instructions Follow up in: 12 months or as needed.   Barnett Applebaum, MD, [facility], [facility] 12/09/2018  1:29 PM

## 2018-12-13 LAB — CYTOLOGY - PAP
Diagnosis: NEGATIVE
Diagnosis: REACTIVE

## 2019-03-20 ENCOUNTER — Other Ambulatory Visit: Payer: Medicaid Other

## 2020-09-16 ENCOUNTER — Ambulatory Visit
Admission: EM | Admit: 2020-09-16 | Discharge: 2020-09-16 | Disposition: A | Discharge status: Home / Self Care | Payer: Medicaid Other | Attending: Physician Assistant | Admitting: Physician Assistant

## 2020-09-16 ENCOUNTER — Ambulatory Visit (INDEPENDENT_AMBULATORY_CARE_PROVIDER_SITE_OTHER): Payer: Medicaid Other

## 2020-09-16 ENCOUNTER — Other Ambulatory Visit: Payer: Self-pay

## 2020-09-16 DIAGNOSIS — R221 Localized swelling, mass and lump, neck: Secondary | ICD-10-CM

## 2020-09-16 LAB — CBC WITH DIFFERENTIAL/PLATELET
Abs Immature Granulocytes: 0.04 10*3/uL (ref 0.00–0.07)
Basophils Absolute: 0.1 10*3/uL (ref 0.0–0.1)
Basophils Relative: 1 %
Eosinophils Absolute: 0 10*3/uL (ref 0.0–0.5)
Eosinophils Relative: 0 %
HCT: 40.6 % (ref 36.0–46.0)
Hemoglobin: 13.7 g/dL (ref 12.0–15.0)
Immature Granulocytes: 0 %
Lymphocytes Relative: 34 %
Lymphs Abs: 3.2 10*3/uL (ref 0.7–4.0)
MCH: 28 pg (ref 26.0–34.0)
MCHC: 33.7 g/dL (ref 30.0–36.0)
MCV: 82.9 fL (ref 80.0–100.0)
Monocytes Absolute: 0.8 10*3/uL (ref 0.1–1.0)
Monocytes Relative: 8 %
Neutro Abs: 5.4 10*3/uL (ref 1.7–7.7)
Neutrophils Relative %: 57 %
Platelets: 382 10*3/uL (ref 150–400)
RBC: 4.9 MIL/uL (ref 3.87–5.11)
RDW: 13.7 % (ref 11.5–15.5)
WBC: 9.6 10*3/uL (ref 4.0–10.5)
nRBC: 0 % (ref 0.0–0.2)

## 2020-09-16 LAB — BASIC METABOLIC PANEL
Anion gap: 7 (ref 5–15)
BUN: 9 mg/dL (ref 6–20)
CO2: 24 mmol/L (ref 22–32)
Calcium: 9.4 mg/dL (ref 8.9–10.3)
Chloride: 106 mmol/L (ref 98–111)
Creatinine, Ser: 0.59 mg/dL (ref 0.44–1.00)
GFR, Estimated: >60 mL/min (ref >60)
Glucose, Bld: 99 mg/dL (ref 70–99)
Potassium: 3.7 mmol/L (ref 3.5–5.1)
Sodium: 137 mmol/L (ref 135–145)

## 2020-09-16 NOTE — ED Notes (Signed)
No prior auth needed for [place] per insurance. Ref number [id number]

## 2020-09-16 NOTE — ED Provider Notes (Signed)
[facility]    CSN: [id number] Arrival date & time: 09/16/20  1310      History   Chief Complaint Chief Complaint  Patient presents with   Lump in throat    HPI Erika Wiley is a 30 y.o. female presenting for swelling/lump of the anterior neck x1 week.  Patient denies any significant change in size in the past couple of days.  She says the area is tender when touched.  She says that the lump hurts when she swallows but denies sore throat, difficulty swallowing, choking or difficulty breathing.  Patient denies any fever, fatigue, appetite changes, weight changes, congestion or cough.  No recent illness.  She denies any weakness, achiness, polydipsia, polyuria urea, cold or heat intolerance.  Does not report any GI issues.  No similar problems in the past.  Patient denies any history of thyroid problems with herself or in her family.  She is otherwise healthy and denies any chronic medical problems.  She has no other complaints or concerns.  HPI  History reviewed. No pertinent past medical history.  Patient Active Problem List   Diagnosis Date Noted   Postpartum care following cesarean delivery 10/30/2018   Previous cesarean delivery affecting pregnancy, antepartum 03/29/2018    Past Surgical History:  Procedure Laterality Date   CESAREAN SECTION     CESAREAN SECTION N/A 10/28/2018   Procedure: CESAREAN SECTION;  Surgeon: Nadara Mustard, MD;  Location: [facility] ORS;  Service: Obstetrics;  Laterality: N/A;    OB History     Gravida  2   Para  2   Term  2   Preterm      AB  0   Living  2      SAB      IAB  0   Ectopic      Multiple  0   Live Births  1            Home Medications    Prior to Admission medications   Medication Sig Start Date End Date Taking? Authorizing Provider  acetaminophen (TYLENOL) 325 MG tablet Take 650 mg by mouth every 6 (six) hours as needed.    [provider]  norgestimate-ethinyl estradiol  (ORTHO-CYCLEN) 0.25-35 MG-MCG tablet Take 1 tablet by mouth daily. 12/09/18   Nadara Mustard, MD    Family History Family History  Problem Relation Age of Onset   Hyperlipidemia Mother    Diabetes Father     Social History Social History   Tobacco Use   Smoking status: Never   Smokeless tobacco: Never  Vaping Use   Vaping Use: Never used  Substance Use Topics   Alcohol use: Not Currently    Comment: social   Drug use: Never     Allergies   Patient has no known allergies.   Review of Systems Review of Systems  Constitutional:  Negative for appetite change, fatigue, fever and unexpected weight change.  HENT:  Negative for congestion, ear pain and sore throat.   Respiratory:  Negative for cough.   Musculoskeletal:  Negative for neck pain and neck stiffness.  Skin:  Negative for color change, rash and wound.       Neck swelling/mass/lump  Neurological:  Negative for dizziness and headaches.    Physical Exam Triage Vital Signs ED Triage Vitals  Enc Vitals Group     BP 09/16/20 1323 136/88     Pulse Rate 09/16/20 1323 94     Resp 09/16/20  1323 16     Temp 09/16/20 1323 98.4 F (36.9 C)     Temp Source 09/16/20 1323 Oral     SpO2 09/16/20 1323 100 %     Weight 09/16/20 1321 256 lb (116.1 kg)     Height 09/16/20 1321 5\' 5"  (1.651 m)     Head Circumference --      Peak Flow --      Pain Score 09/16/20 1321 8     Pain Loc --      Pain Edu? --      Excl. in [place]? --    No data found.  Updated Vital Signs BP 136/88 (BP Location: Left Arm)   Pulse 94   Temp 98.4 F (36.9 C) (Oral)   Resp 16   Ht 5\' 5"  (1.651 m)   Wt 256 lb (116.1 kg)   LMP 08/23/2020   SpO2 100%   BMI 42.60 kg/m       Physical Exam Vitals and nursing note reviewed.  Constitutional:      General: She is not in acute distress.    Appearance: Normal appearance. She is not ill-appearing or toxic-appearing.  HENT:     Head: Normocephalic and atraumatic.     Nose: Nose normal.      Mouth/Throat:     Mouth: Mucous membranes are moist.     Pharynx: Oropharynx is clear. No posterior oropharyngeal erythema.  Eyes:     General: No scleral icterus.       Right eye: No discharge.        Left eye: No discharge.     Conjunctiva/sclera: Conjunctivae normal.  Neck:     Comments: There is an area of swelling/mass of the anterior neck about the thyroid (~3 cm x 2 cm) that is mildly tender to palpation. No skin color changes or increased warmth. Cardiovascular:     Rate and Rhythm: Normal rate and regular rhythm.     Heart sounds: Normal heart sounds.  Pulmonary:     Effort: Pulmonary effort is normal. No respiratory distress.     Breath sounds: Normal breath sounds.  Musculoskeletal:     Cervical back: Normal range of motion and neck supple.  Lymphadenopathy:     Cervical: No cervical adenopathy.  Skin:    General: Skin is dry.  Neurological:     General: No focal deficit present.     Mental Status: She is alert. Mental status is at baseline.     Motor: No weakness.     Gait: Gait normal.  Psychiatric:        Mood and Affect: Mood normal.        Behavior: Behavior normal.        Thought Content: Thought content normal.     UC Treatments / Results  Labs (all labs ordered are listed, but only abnormal results are displayed) Labs Reviewed  CBC WITH DIFFERENTIAL/PLATELET  BASIC METABOLIC PANEL  THYROID PANEL WITH TSH    EKG   Radiology THYROID  Result Date: 09/16/2020 CLINICAL DATA:  30 year old female with neck EXAM: THYROID ULTRASOUND TECHNIQUE: Ultrasound examination of the thyroid gland and adjacent soft tissues was performed. COMPARISON:  None. FINDINGS: Parenchymal Echotexture: Normal Isthmus: 0.6 cm Right lobe: 4.2 cm x 1.3 cm cm Left lobe: 0.3 cm 4 cm cm _________________________________________________________ Estimated total number of nodules >/= 1 cm: 0 Number of spongiform nodules >/=  2 cm not described below (TR1): 0 Number of mixed cystic and  solid nodules >/= 1.5 cm not described below (TR2): 0 _________________________________________________________ Ill-defined hypoechoic soft tissue nodule/mass the anterior left neck with poorly defined margins. This measures 2.3 cm x 1.3 cm x 1.8 cm. No focal fluid. Lymph nodes present, borderline enlarged typical architecture. IMPRESSION: Unremarkable appearance of thyroid. Soft tissue mass measuring approximately 2.3 cm in greatest diameter with poorly defined margins. Further evaluation with contrast-enhanced neck CT recommended. Electronically Signed   By: Gilmer Mor D.O.   On: 09/16/2020 16:46    Procedures Procedures (including critical care time)  Medications Ordered in UC Medications - No data to display  Initial Impression / Assessment and Plan / UC Course  I have reviewed the triage vital signs and the nursing notes.  Pertinent labs & imaging results that were available during my care of the patient were reviewed by me and considered in my medical decision making (see chart for details).   30 y/o female presenting for swelling/mass of anterior neck about the thyroid x 1 week. Area is TTP. No associated symptoms. No red flags.   CBC, BMP and thyroid panel obtained.  CBC and BMP normal.  Thyroid panel pending.  US thyroid ordered for evaluation of neck mass. Suspect goiter. Patient did not want to wait for results. Advised her that I would call with results and go from there. Advised if goiter or thyroid problem, will refer to endocrinology and depending on other findings, may be treated at home or directed to ED.   [place] result: normal thyroid. 2.3 cm in diameter soft tissue mass with poorly defined margins. Recommend contrast enhanced CT of neck.   Called to review results with patient and advised her to go to ED for CT since we do not have the capability to perform CT with contrast in clinic tonight. Patient is agreeable so I called [facility] and spoke to charge nurse to give report.     Final Clinical Impressions(s) / UC Diagnoses   Final diagnoses:  Localized swelling, mass and lump, neck     Discharge Instructions      Labs look good. The thyroid test will be back later today or tomorrow and we will call if abnormal. Results will be on Mychart as well.   I will call with results of the ultrasound and we can go from there. Might need to order more tests or if thyroid issue, I will try to place a referral to endocrinology, but since you have Medicaid it may need to come from your PCP. If you don't have a PCP, you need to contact Medicaid and establish.      ED Prescriptions   None    PDMP not reviewed this encounter.   Shirlee Latch, PA-C 09/16/20 1707

## 2020-09-16 NOTE — Discharge Instructions (Addendum)
Labs look good. The thyroid test will be back later today or tomorrow and we will call if abnormal. Results will be on Mychart as well.   I will call with results of the ultrasound and we can go from there. Might need to order more tests or if thyroid issue, I will try to place a referral to endocrinology, but since you have Medicaid it may need to come from your PCP. If you don't have a PCP, you need to contact Medicaid and establish.

## 2020-09-16 NOTE — ED Triage Notes (Signed)
Pt states she noticed a lump in her throat (Can be seen external) 1 week ago sometimes pain with swallowing hurts with touch, no fever, no other SX.

## 2020-09-17 LAB — THYROID PANEL WITH TSH
Free Thyroxine Index: 2.2 (ref 1.2–4.9)
T3 Uptake Ratio: 27 % (ref 24–39)
T4, Total: 8.1 ug/dL (ref 4.5–12.0)
TSH: 2.54 u[IU]/mL (ref 0.450–4.500)

## 2022-02-20 IMAGING — US US THYROID
1 series · 14 of 25 positions shown · non-contrast
Comparison: None.

CLINICAL DATA: 30-year-old female with neck

EXAM:
THYROID ULTRASOUND
TECHNIQUE: Ultrasound examination of the thyroid gland and adjacent soft
tissues was performed.

[Series 1: us thyroid · 0.09mm/px · 14 of 55 slices shown]
[im 1/55]
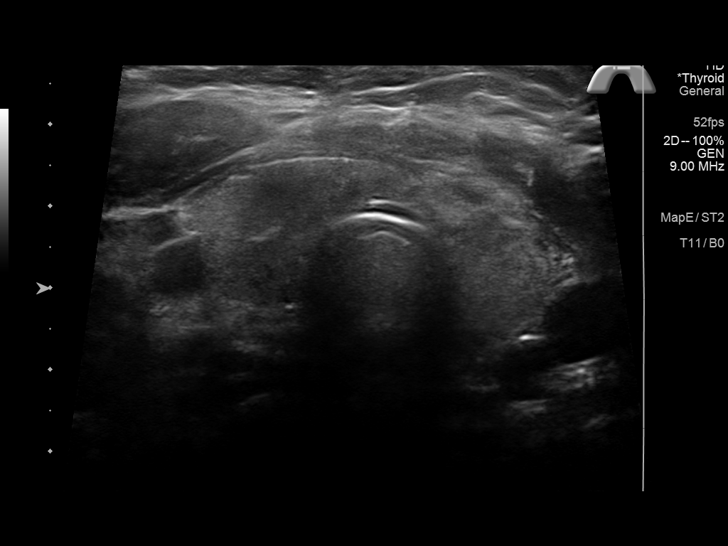
[im 5/55]
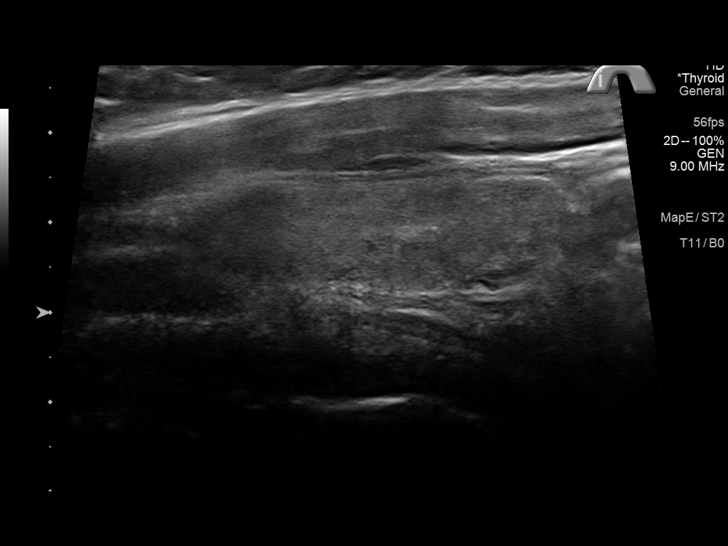
[im 10/55]
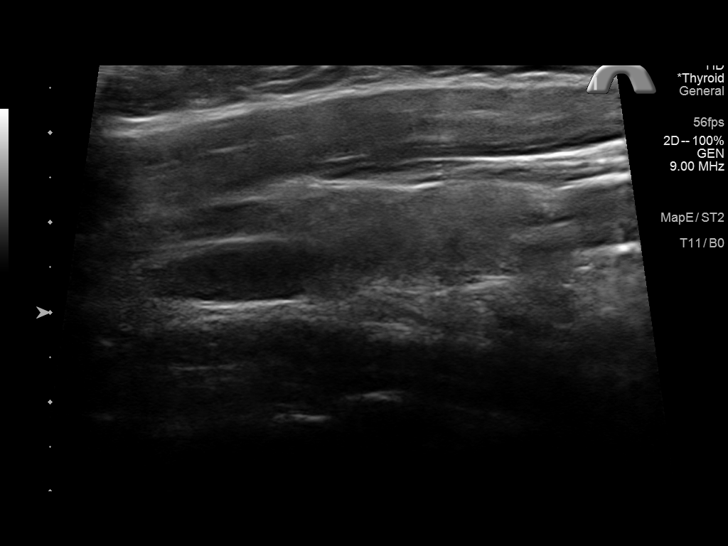
[im 14/55]
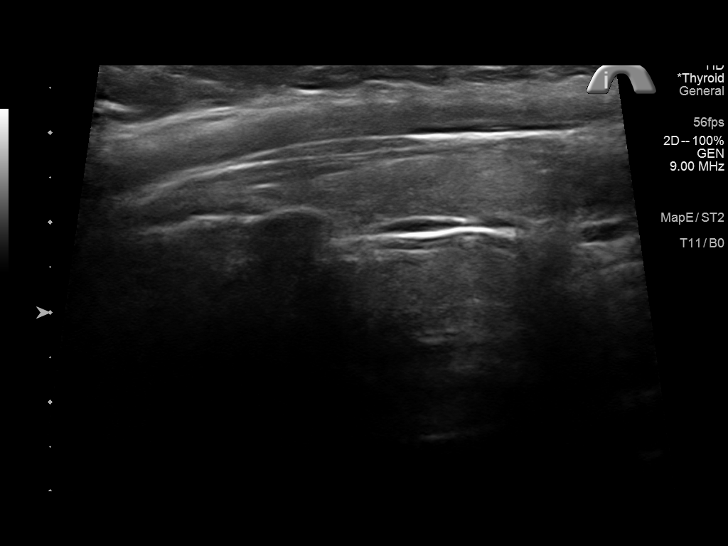
[im 19/55]
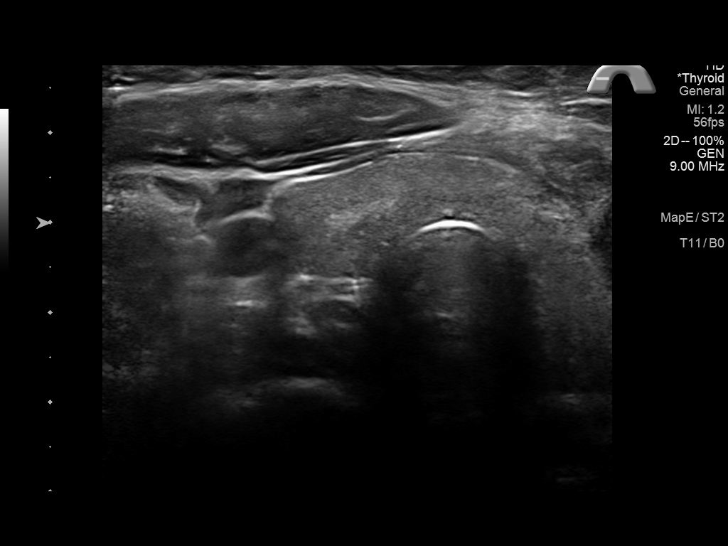
[im 21/55]
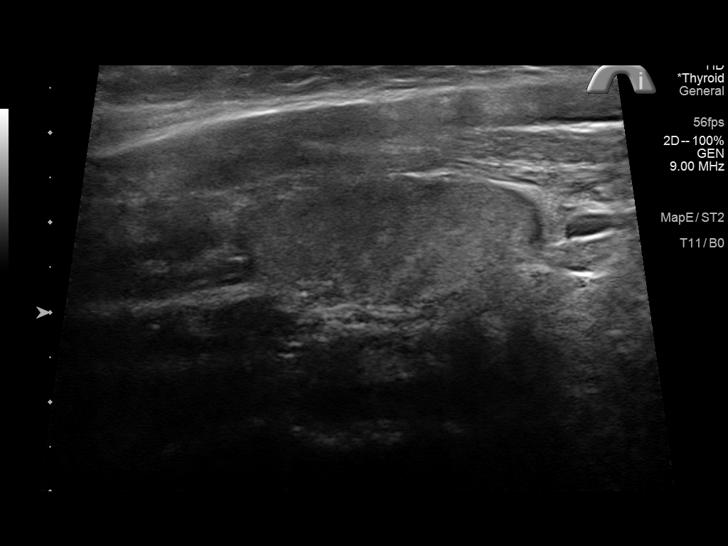
[im 25/55]
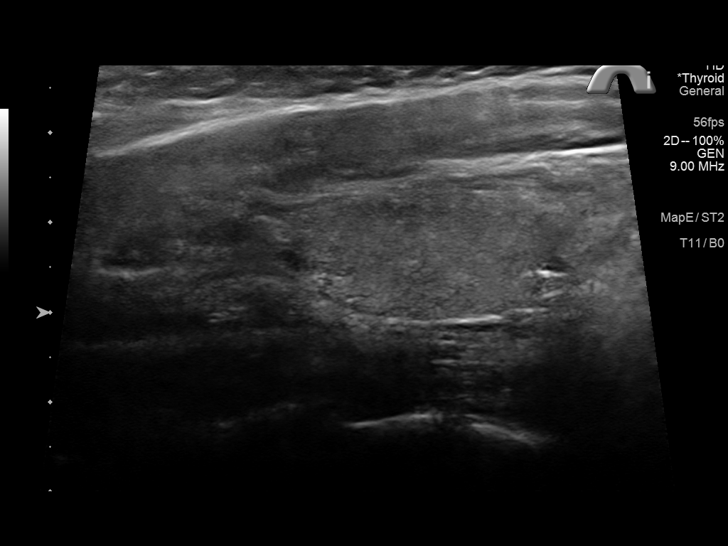
[im 30/55]
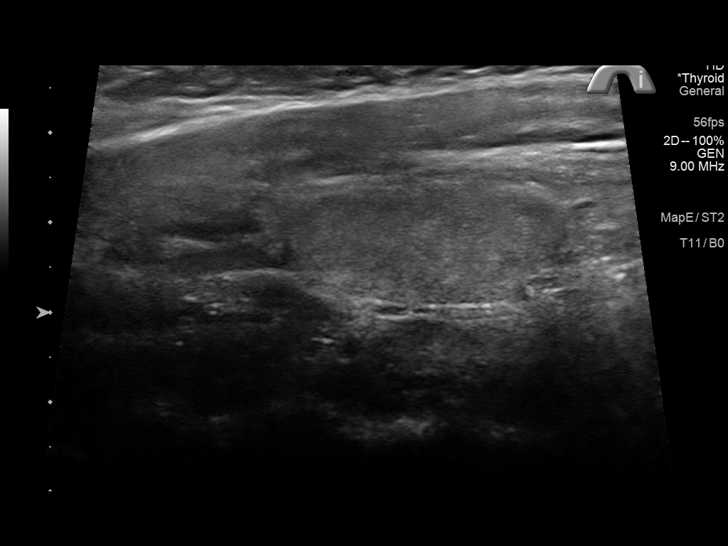
[im 34/55]
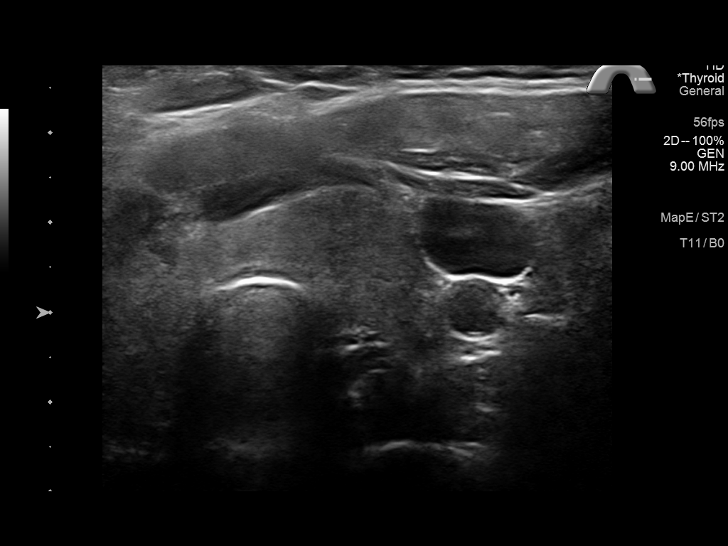
[im 37/55]
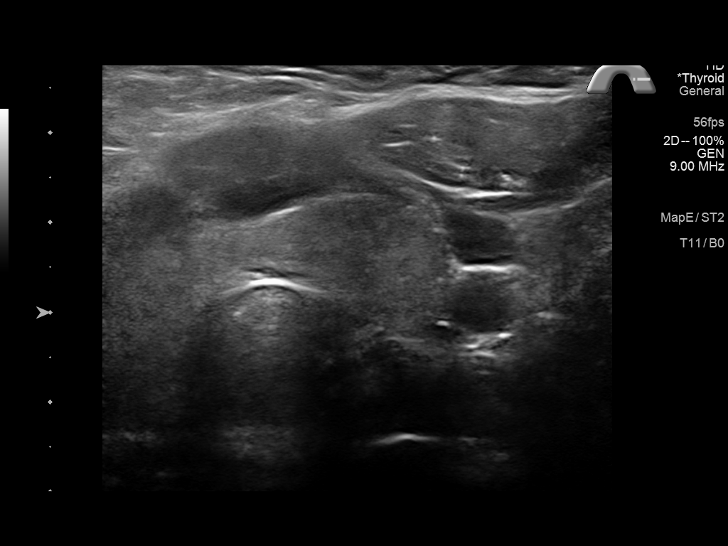
[im 41/55]
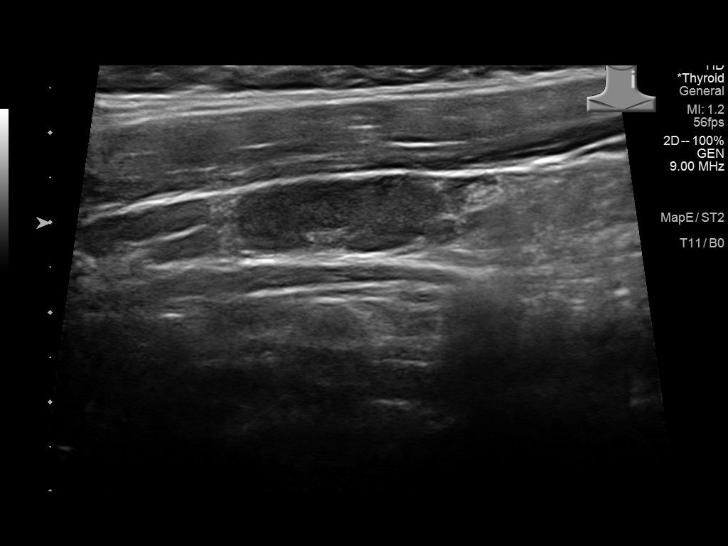
[im 46/55]
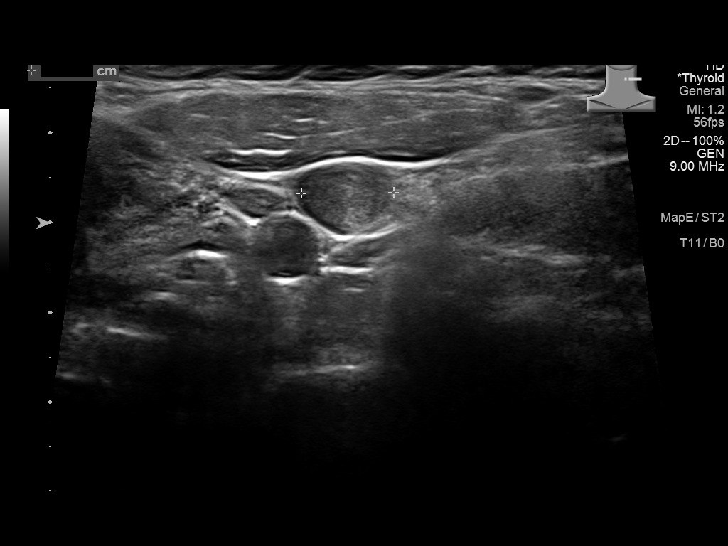
[im 50/55]
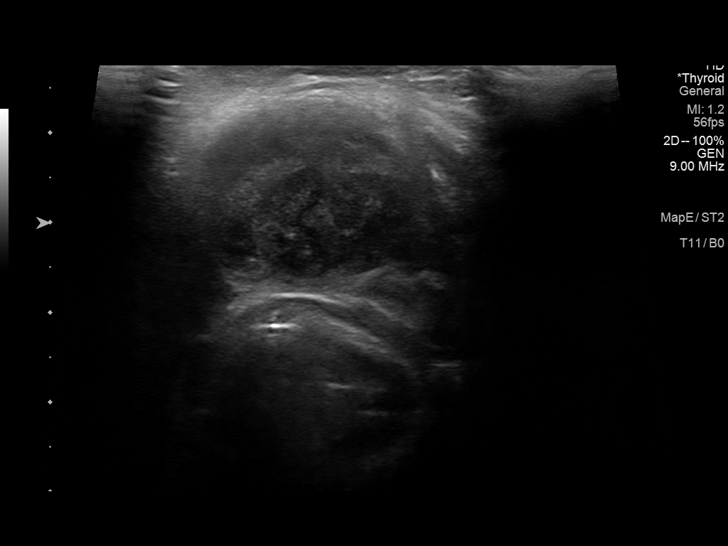
[im 55/55]
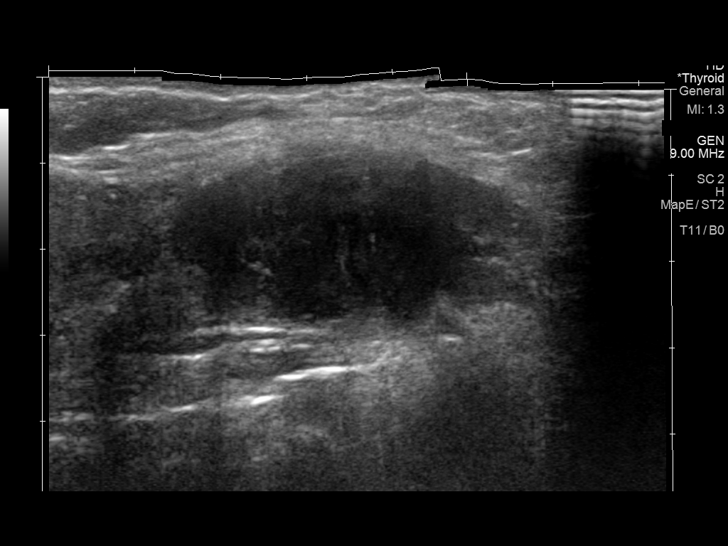

[14 of 25 positions shown; findings below may reference images not displayed]

FINDINGS: Parenchymal Echotexture: Normal

Isthmus: 0.6 cm

Right lobe: 4.2 cm x 1.3 cm cm

Left lobe: 0.3 cm 4 cm cm

_________________________________________________________

Estimated total number of nodules >/= 1 cm: 0

Number of spongiform nodules >/=  2 cm not described below (TR1): 0

Number of mixed cystic and solid nodules >/= 1.5 cm not described
below (TR2): 0

_________________________________________________________

Ill-defined hypoechoic soft tissue nodule/mass the anterior left
neck with poorly defined margins. This measures 2.3 cm x 1.3 cm x
1.8 cm.

No focal fluid.

Lymph nodes present, borderline enlarged typical architecture.
IMPRESSION: Unremarkable appearance of thyroid.

Soft tissue mass measuring approximately 2.3 cm in greatest diameter
with poorly defined margins. Further evaluation with
contrast-enhanced neck CT recommended.

## 2022-04-05 ENCOUNTER — Ambulatory Visit
Admission: EM | Admit: 2022-04-05 | Discharge: 2022-04-05 | Disposition: A | Discharge status: Home / Self Care | Payer: 59 | Attending: Family Medicine | Admitting: Family Medicine

## 2022-04-05 DIAGNOSIS — R0789 Other chest pain: Secondary | ICD-10-CM

## 2022-04-05 DIAGNOSIS — I10 Essential (primary) hypertension: Secondary | ICD-10-CM

## 2022-04-05 DIAGNOSIS — K219 Gastro-esophageal reflux disease without esophagitis: Secondary | ICD-10-CM

## 2022-04-05 DIAGNOSIS — R1013 Epigastric pain: Secondary | ICD-10-CM

## 2022-04-05 DIAGNOSIS — R001 Bradycardia, unspecified: Secondary | ICD-10-CM

## 2022-04-05 LAB — COMPREHENSIVE METABOLIC PANEL
ALT: 22 U/L (ref 0–44)
AST: 17 U/L (ref 15–41)
Albumin: 3.8 g/dL (ref 3.5–5.0)
Alkaline Phosphatase: 52 U/L (ref 38–126)
Anion gap: 7 (ref 5–15)
BUN: 10 mg/dL (ref 6–20)
CO2: 25 mmol/L (ref 22–32)
Calcium: 8.7 mg/dL — ABNORMAL LOW (ref 8.9–10.3)
Chloride: 101 mmol/L (ref 98–111)
Creatinine, Ser: 0.61 mg/dL (ref 0.44–1.00)
GFR, Estimated: >60 mL/min (ref >60)
Glucose, Bld: 110 mg/dL — ABNORMAL HIGH (ref 70–99)
Potassium: 4.2 mmol/L (ref 3.5–5.1)
Sodium: 133 mmol/L — ABNORMAL LOW (ref 135–145)
Total Bilirubin: 0.3 mg/dL (ref 0.3–1.2)
Total Protein: 7.4 g/dL (ref 6.5–8.1)

## 2022-04-05 LAB — CBC WITH DIFFERENTIAL/PLATELET
Abs Immature Granulocytes: 0.02 10*3/uL (ref 0.00–0.07)
Basophils Absolute: 0.1 10*3/uL (ref 0.0–0.1)
Basophils Relative: 1 %
Eosinophils Absolute: 0.1 10*3/uL (ref 0.0–0.5)
Eosinophils Relative: 1 %
HCT: 35.7 % — ABNORMAL LOW (ref 36.0–46.0)
Hemoglobin: 11.7 g/dL — ABNORMAL LOW (ref 12.0–15.0)
Immature Granulocytes: 0 %
Lymphocytes Relative: 31 %
Lymphs Abs: 2.1 10*3/uL (ref 0.7–4.0)
MCH: 28.1 pg (ref 26.0–34.0)
MCHC: 32.8 g/dL (ref 30.0–36.0)
MCV: 85.6 fL (ref 80.0–100.0)
Monocytes Absolute: 0.4 10*3/uL (ref 0.1–1.0)
Monocytes Relative: 6 %
Neutro Abs: 4.2 10*3/uL (ref 1.7–7.7)
Neutrophils Relative %: 61 %
Platelets: 313 10*3/uL (ref 150–400)
RBC: 4.17 MIL/uL (ref 3.87–5.11)
RDW: 13.3 % (ref 11.5–15.5)
WBC: 6.9 10*3/uL (ref 4.0–10.5)
nRBC: 0 % (ref 0.0–0.2)

## 2022-04-05 LAB — TROPONIN I (HIGH SENSITIVITY): Troponin I (High Sensitivity): <2 ng/L (ref <18)

## 2022-04-05 LAB — LIPASE, BLOOD: Lipase: 31 U/L (ref 11–51)

## 2022-04-05 MED ORDER — ONDANSETRON HCL 4 MG/2ML IJ SOLN
4.0000 mg | Freq: Once | INTRAMUSCULAR | Status: AC
  Administered 2022-04-05: 4 mg via INTRAMUSCULAR

## 2022-04-05 MED ORDER — KETOROLAC TROMETHAMINE 60 MG/2ML IM SOLN
30.0000 mg | Freq: Once | INTRAMUSCULAR | Status: AC
  Administered 2022-04-05: 30 mg via INTRAMUSCULAR

## 2022-04-05 MED ORDER — LIDOCAINE VISCOUS HCL 2 % MT SOLN
15.0000 mL | Freq: Once | OROMUCOSAL | Status: AC
  Administered 2022-04-05: 15 mL via OROMUCOSAL

## 2022-04-05 MED ORDER — ALUM & MAG HYDROXIDE-SIMETH 200-200-20 MG/5ML PO SUSP
30.0000 mL | Freq: Once | ORAL | Status: AC
  Administered 2022-04-05: 30 mL via ORAL

## 2022-04-05 MED ORDER — OMEPRAZOLE 40 MG PO CPDR: 40.0000 mg | DELAYED_RELEASE_CAPSULE | Freq: Every day | ORAL | 0 refills | Status: AC

## 2022-04-05 NOTE — ED Provider Notes (Signed)
[facility]    CSN: [id number] Arrival date & time: 04/05/22  1315      History   Chief Complaint Chief Complaint  Patient presents with   Abdominal Pain    HPI Erika Wiley is a 32 y.o. female.   HPI  Erika Wiley presents for intermittent abdominal pain since Sunday. Had no pain on Monday and Tuesday but came back today. No history of reflux or gall bladder issues.  She has upper abdominal pain that wraps around to the middle of her pain.  She vomited twice around 12 PM. Vomiting didn't help the pain.  Denies chest pain, shortness of breath. Pain woke her up from sleep around 730 AM.  Pepto Bismol and Advil didn't help.  Had a severe headache on Sunday. Denies sweating, numbness or tingling in arms, diarrhea.    Past Surgeries: C-section x2.   Symptoms Nausea/Vomiting: yes  Diarrhea: no  Constipation: no  Melena/BRBPR: no  Hematemesis: no  Anorexia: yes  Fever/Chills: no  Dysuria: no  Rash: no  NSAIDs/ASA: yes  LMP: Patient's last menstrual period was 03/22/2022 (approximate). Sore throat: no  Cough: no Sleep disturbance: yes Back Pain: yes Headache: yes  History reviewed. No pertinent past medical history.  Patient Active Problem List   Diagnosis Date Noted   Postpartum care following cesarean delivery 10/30/2018   Previous cesarean delivery affecting pregnancy, antepartum 03/29/2018    Past Surgical History:  Procedure Laterality Date   CESAREAN SECTION     CESAREAN SECTION N/A 10/28/2018   Procedure: CESAREAN SECTION;  Surgeon: Gae Dry, MD;  Location: [facility] ORS;  Service: Obstetrics;  Laterality: N/A;    OB History     Gravida  2   Para  2   Term  2   Preterm      AB  0   Living  2      SAB      IAB  0   Ectopic      Multiple  0   Live Births  1            Home Medications    Prior to Admission medications   Medication Sig Start Date End Date Taking? Authorizing Provider  omeprazole (PRILOSEC) 40 MG  capsule Take 1 capsule (40 mg total) by mouth daily. 04/05/22 05/05/22 Yes Marlynn Hinckley, Ronnette Juniper, DO  acetaminophen (TYLENOL) 325 MG tablet Take 650 mg by mouth every 6 (six) hours as needed.    [provider]  norgestimate-ethinyl estradiol (ORTHO-CYCLEN) 0.25-35 MG-MCG tablet Take 1 tablet by mouth daily. 12/09/18   Gae Dry, MD    Family History Family History  Problem Relation Age of Onset   Hyperlipidemia Mother    Diabetes Father     Social History Social History   Tobacco Use   Smoking status: Never   Smokeless tobacco: Never  Vaping Use   Vaping Use: Never used  Substance Use Topics   Alcohol use: Not Currently    Comment: social   Drug use: Never     Allergies   Patient has no known allergies.   Review of Systems Review of Systems :negative unless otherwise stated in HPI.      Physical Exam Triage Vital Signs ED Triage Vitals  Enc Vitals Group     BP 04/05/22 1354 (!) 160/98     Pulse Rate 04/05/22 1354 (!) 50     Resp 04/05/22 1354 16     Temp 04/05/22 1354 98.4 F (  36.9 C)     Temp Source 04/05/22 1354 Oral     SpO2 04/05/22 1354 98 %     Weight 04/05/22 1353 280 lb (127 kg)     Height 04/05/22 1353 '5\' 5"'$  (1.651 m)     Head Circumference --      Peak Flow --      Pain Score 04/05/22 1353 10     Pain Loc --      Pain Edu? --      Excl. in [place]? --    No data found.  Updated Vital Signs BP (!) 142/90 (BP Location: Right Arm)   Pulse (!) 56   Temp 98.4 F (36.9 C) (Oral)   Resp 16   Ht '5\' 5"'$  (1.651 m)   Wt 132.8 kg   LMP 03/22/2022 (Approximate)   SpO2 98%   BMI 48.72 kg/m   Visual Acuity Right Eye Distance:   Left Eye Distance:   Bilateral Distance:    Right Eye Near:   Left Eye Near:    Bilateral Near:     Physical Exam  GEN: uncomfortable appearing obsese female, in no acute distress  CV: regular rhythm, bradycardic, no murmurs appreciated  RESP: no increased work of breathing, clear to ascultation bilaterally ABD:  Bowel sounds present. Soft, epigastric, non-distended.  No guarding, no rebound, no appreciable hepatosplenomegaly, no CVA tenderness, negative McBurney's, negative Murphy MSK: no extremity edema SKIN: warm, dry, no rash on visible skin NEURO: alert, moves all extremities appropriately PSYCH: Normal affect, appropriate speech and behavior   UC Treatments / Results  Labs (all labs ordered are listed, but only abnormal results are displayed) Labs Reviewed  CBC WITH DIFFERENTIAL/PLATELET - Abnormal; Notable for the following components:      Result Value   Hemoglobin 11.7 (*)    HCT 35.7 (*)    All other components within normal limits  COMPREHENSIVE METABOLIC PANEL - Abnormal; Notable for the following components:   Sodium 133 (*)    Glucose, Bld 110 (*)    Calcium 8.7 (*)    All other components within normal limits  LIPASE, BLOOD  TROPONIN I (HIGH SENSITIVITY)    EKG   Radiology No results found.  Procedures Procedures (including critical care time)  Medications Ordered in UC Medications  ondansetron (ZOFRAN) injection 4 mg (4 mg Intramuscular Given 04/05/22 1434)  ondansetron (ZOFRAN) injection 4 mg (4 mg Intramuscular Given 04/05/22 1433)  ketorolac (TORADOL) injection 30 mg (30 mg Intramuscular Given 04/05/22 1433)  alum & mag hydroxide-simeth (MAALOX/MYLANTA) 200-200-20 MG/5ML suspension 30 mL (30 mLs Oral Given 04/05/22 1433)  lidocaine (XYLOCAINE) 2 % viscous mouth solution 15 mL (15 mLs Mouth/Throat Given 04/05/22 1433)    Initial Impression / Assessment and Plan / UC Course  I have reviewed the triage vital signs and the nursing notes.  Pertinent labs & imaging results that were available during my care of the patient were reviewed by me and considered in my medical decision making (see chart for details).        Patient is a  32 y.o. female who presents after having intermittent insidious epigastric abdominal pain 3-4 days.  Patient is uncomfortable-appearing,  well-hydrated, and in no acute distress. She is mildly hypertensive.  [place] afebrile.  She has epigastric tenderness though  is not concerning for an acute abdomen.  Obtained CBC, CMP, and lipase.   CBC remarkable for normocytic anemia.  Hemoglobin 11.7.  She has no leukocytosis.  BP 168/98 then 142/90. She takes Lisinopril for hypertension.  Lipase is normal therefore I doubt acute pancreatitis.  On CMP she has mild hyponatremia at sodium 133 was grossly unremarkable.  No development in her serum creatinine or LFTs.  She has some lower epigastric pain with back pain and bradycardia therefore an EKG was obtained.  She showed sinus bradycardia with APCs.  She does have some mild upsloping without ST elevation and nonspecific T wave abnormalities.  No PR interval elongation QT prolongation.  Previous EKG from May 2013 showed sinus tachycardia with arrhythmia she was did not have bradycardia at that time.  Initial troponin is negative (<2). Doubt acute cardiac pathology. Symptoms have improved with GI cocktail and IM Toradol. Suspect gastrointestinal reflux.  Treated at home with omeprazole.    Strict ED precautions given.  Advise follow-up with cardiologist about her bradycardia. Discussed MDM, treatment plan and plan for follow-up with patient  who agrees with plan.    Final Clinical Impressions(s) / UC Diagnoses   Final diagnoses:  Epigastric pain  Bradycardia  Gastroesophageal reflux disease without esophagitis  Atypical chest pain  Essential hypertension     Discharge Instructions      You should see a heart doctor regarding your low heart rate. Speak with your primary care doctor about a referral.  Your heart enzyme did not show evidence of a heart attack.    Your pancreas, kidneys and liver are functioning normally.  You have a slightly lower sodium level.    I suspect your pain is acid reflux related. See handout.  Stop by the pharmacy to pick up your prescription.  Follow up  with your primary care provider.         ED Prescriptions     Medication Sig Dispense Auth. Provider   omeprazole (PRILOSEC) 40 MG capsule Take 1 capsule (40 mg total) by mouth daily. 30 capsule Lyndee Hensen, DO      PDMP not reviewed this encounter.   Lyndee Hensen, DO 04/06/22 1030

## 2022-04-05 NOTE — ED Triage Notes (Signed)
Pt c/o severe upper abd pain & nausea x7 days. 1 episode of emesis this AM, denies any diarrhea. LBM:2 hrs ago.

## 2022-04-05 NOTE — Discharge Instructions (Addendum)
You should see a heart doctor regarding your low heart rate. Speak with your primary care doctor about a referral.  Your heart enzyme did not show evidence of a heart attack.    Your pancreas, kidneys and liver are functioning normally.  You have a slightly lower sodium level.    I suspect your pain is acid reflux related. See handout.  Stop by the pharmacy to pick up your prescription.  Follow up with your primary care provider.

## 2023-04-25 DIAGNOSIS — E559 Vitamin D deficiency, unspecified: Secondary | ICD-10-CM | POA: Insufficient documentation

## 2023-08-23 ENCOUNTER — Ambulatory Visit: Payer: Self-pay

## 2023-10-18 ENCOUNTER — Ambulatory Visit
Admission: EM | Admit: 2023-10-18 | Discharge: 2023-10-18 | Disposition: A | Discharge status: Home / Self Care | Payer: Self-pay | Attending: Family Medicine | Admitting: Family Medicine

## 2023-10-18 DIAGNOSIS — N926 Irregular menstruation, unspecified: Secondary | ICD-10-CM | POA: Insufficient documentation

## 2023-10-18 DIAGNOSIS — Z3202 Encounter for pregnancy test, result negative: Secondary | ICD-10-CM | POA: Insufficient documentation

## 2023-10-18 LAB — PREGNANCY, URINE: Preg Test, Ur: NEGATIVE

## 2023-10-18 NOTE — Discharge Instructions (Addendum)
 Your pregnancy test is negative. Follow up with your gynecologist testing for further work up if you miss another period.

## 2023-10-18 NOTE — ED Provider Notes (Signed)
 [facility]    CSN: [id number] Arrival date & time: 10/18/23  1048      History   Chief Complaint Chief Complaint  Patient presents with   Possible Pregnancy     HPI HPI Erika Wiley is a 33 y.o. female.    Erika Wiley  is 12 days late on her cycle.  She took 2 home pregnancy tests that were negative. She wants to know why she is not having her cycle. She typically has a period monthly and they last 3-4 days.  No birth control used in the past 3 months.  She had an abortion in April but had regular cycles in June, July and August. Last period was 09/09/10 and that was a normal period in length and flow.   She has not been under a lot of stress. No new medications.  No history of PCOS.       History reviewed. No pertinent past medical history.  Patient Active Problem List   Diagnosis Date Noted   Vitamin D deficiency 04/25/2023   Postpartum care following cesarean delivery 10/30/2018   Class 3 severe obesity with body mass index (BMI) of 45.0 to 49.9 in adult 10/17/2018   Previous cesarean delivery affecting pregnancy, antepartum 03/29/2018    Past Surgical History:  Procedure Laterality Date   CESAREAN SECTION     CESAREAN SECTION N/A 10/28/2018   Procedure: CESAREAN SECTION;  Surgeon: Arloa Lamar SQUIBB, MD;  Location: [facility] ORS;  Service: Obstetrics;  Laterality: N/A;    OB History     Gravida  2   Para  2   Term  2   Preterm      AB  0   Living  2      SAB      IAB  0   Ectopic      Multiple  0   Live Births  1            Home Medications    Prior to Admission medications   Medication Sig Start Date End Date Taking? Authorizing Provider  acetaminophen  (TYLENOL ) 325 MG tablet Take 650 mg by mouth every 6 (six) hours as needed.    [provider]  norgestimate -ethinyl estradiol  (ORTHO-CYCLEN) 0.25-35 MG-MCG tablet Take 1 tablet by mouth daily. 12/09/18   Arloa Lamar SQUIBB, MD  omeprazole  (PRILOSEC) 40 MG  capsule Take 1 capsule (40 mg total) by mouth daily. 04/05/22 05/05/22  Kriste Berth, DO    Family History Family History  Problem Relation Age of Onset   Hyperlipidemia Mother    Diabetes Father     Social History Social History   Tobacco Use   Smoking status: Never   Smokeless tobacco: Never  Vaping Use   Vaping status: Never Used  Substance Use Topics   Alcohol use: Not Currently    Comment: social   Drug use: Never     Allergies   Patient has no known allergies.   Review of Systems Review of Systems: :negative unless otherwise stated in HPI.      Physical Exam Triage Vital Signs ED Triage Vitals  Encounter Vitals Group     BP 10/18/23 1053 (!) 154/97     Girls Systolic BP Percentile --      Girls Diastolic BP Percentile --      Boys Systolic BP Percentile --      Boys Diastolic BP Percentile --      Pulse Rate 10/18/23 1053 60  Resp 10/18/23 1053 16     Temp 10/18/23 1053 98.1 F (36.7 C)     Temp Source 10/18/23 1053 Oral     SpO2 10/18/23 1053 99 %     Weight 10/18/23 1052 (!) 309 lb 6.4 oz (140.3 kg)     Height 10/18/23 1052 5' 5 (1.651 m)     Head Circumference --      Peak Flow --      Pain Score 10/18/23 1058 0     Pain Loc --      Pain Education --      Exclude from Growth Chart --    No data found.  Updated Vital Signs BP (!) 154/97 (BP Location: Left Wrist)   Pulse 60   Temp 98.1 F (36.7 C) (Oral)   Resp 16   Ht 5' 5 (1.651 m)   Wt (!) 140.3 kg   LMP 09/09/2023 (Exact Date)   SpO2 99%   BMI 51.49 kg/m   Visual Acuity Right Eye Distance:   Left Eye Distance:   Bilateral Distance:    Right Eye Near:   Left Eye Near:    Bilateral Near:     Physical Exam GEN: well appearing female in no acute distress  CVS: well perfused  RESP: speaking in full sentences without pause  ABD: soft, non-tender, non-distended, no palpable masses, uterus below the umbilicus   UC Treatments / Results  Labs (all labs ordered are  listed, but only abnormal results are displayed) Labs Reviewed  PREGNANCY, URINE    EKG   Radiology No results found.  Procedures Procedures (including critical care time)  Medications Ordered in UC Medications - No data to display  Initial Impression / Assessment and Plan / UC Course  I have reviewed the triage vital signs and the nursing notes.  Pertinent labs & imaging results that were available during my care of the patient were reviewed by me and considered in my medical decision making (see chart for details).       Patient is a 33 y.o. female  who presents for pregnancy test after missing her period. Overall patient is well-appearing and afebrile.   She is hypertensive here otherwise vital signs stable.  Urine pregnancy test is negative. Discussed etiologies of missed period with patient. She will follow up with her gynecologist.  Discussed MDM, treatment plan and plan for follow-up with patient who agrees with plan.        Final Clinical Impressions(s) / UC Diagnoses   Final diagnoses:  Negative pregnancy test  Missed period     Discharge Instructions      Your pregnancy test is negative. Follow up with your gynecologist testing for further work up if you miss another period.      ED Prescriptions   None    PDMP not reviewed this encounter.   Elic Vencill, DO 10/18/23 1142

## 2023-10-18 NOTE — ED Triage Notes (Signed)
 Pt presents to UC for poss pregnancy. States has taken multiple home tests at home which were neg. LMP:8/3. Denies any sx's.
